# Patient Record
Sex: Female | Born: 2000 | Race: Black or African American | Hispanic: No | Marital: Single | Smoking: Never smoker
Health system: Southern US, Community
[De-identification: ages and names within clinical notes are randomized; demographics above are authoritative.]

## PROBLEM LIST (undated history)

## (undated) DIAGNOSIS — Z789 Other specified health status: Secondary | ICD-10-CM

## (undated) HISTORY — PX: NO PAST SURGERIES: SHX2092

---
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
## 2018-11-27 ENCOUNTER — Emergency Department (HOSPITAL_COMMUNITY)
Admission: EM | Admit: 2018-11-27 | Discharge: 2018-11-27 | Disposition: A | Discharge status: Home / Self Care | Payer: BLUE CROSS/BLUE SHIELD | Attending: Emergency Medicine | Admitting: Emergency Medicine

## 2018-11-27 ENCOUNTER — Other Ambulatory Visit: Payer: Self-pay

## 2018-11-27 ENCOUNTER — Emergency Department (HOSPITAL_COMMUNITY): Payer: BLUE CROSS/BLUE SHIELD

## 2018-11-27 ENCOUNTER — Encounter (HOSPITAL_COMMUNITY): Payer: Self-pay | Admitting: Emergency Medicine

## 2018-11-27 DIAGNOSIS — R69 Illness, unspecified: Secondary | ICD-10-CM

## 2018-11-27 DIAGNOSIS — R509 Fever, unspecified: Secondary | ICD-10-CM

## 2018-11-27 DIAGNOSIS — J111 Influenza due to unidentified influenza virus with other respiratory manifestations: Secondary | ICD-10-CM

## 2018-11-27 DIAGNOSIS — R059 Cough, unspecified: Secondary | ICD-10-CM

## 2018-11-27 DIAGNOSIS — R05 Cough: Secondary | ICD-10-CM | POA: Diagnosis not present

## 2018-11-27 LAB — GROUP A STREP BY PCR: GROUP A STREP BY PCR: NOT DETECTED

## 2018-11-27 MED ORDER — IPRATROPIUM-ALBUTEROL 0.5-2.5 (3) MG/3ML IN SOLN
3.0000 mL | Freq: Once | RESPIRATORY_TRACT | Status: AC
  Administered 2018-11-27: 3 mL via RESPIRATORY_TRACT
  Filled 2018-11-27: qty 3

## 2018-11-27 MED ORDER — ACETAMINOPHEN 325 MG PO TABS
650.0000 mg | ORAL_TABLET | Freq: Once | ORAL | Status: DC | PRN
  Filled 2018-11-27 (×2): qty 2

## 2018-11-27 MED ORDER — ALBUTEROL SULFATE 108 (90 BASE) MCG/ACT IN AEPB: 2.0000 | INHALATION_SPRAY | RESPIRATORY_TRACT | 0 refills | Status: DC | PRN

## 2018-11-27 NOTE — Discharge Instructions (Signed)

## 2018-11-27 NOTE — ED Triage Notes (Signed)
Pt c/o fever and sore throat x 2 days.

## 2018-11-27 NOTE — ED Provider Notes (Signed)
[facility] EMERGENCY DEPARTMENT Provider Note   CSN: [id number] Arrival date & time: 11/27/18  1518    History   Chief Complaint Chief Complaint  Patient presents with  . Sore Throat    HPI April Ramos is a 18 y.o. female. SUBJECTIVE:  April Ramos is a 18 y.o. female who present complaining of flu-like symptoms: fevers, chills, myalgias, congestion, sore throat and cough for 3 days. Denies dyspnea. + wheezing. She smokes She denies a hx of asthma          History reviewed. No pertinent past medical history.  There are no active problems to display for this patient.   History reviewed. No pertinent surgical history.   OB History   No obstetric history on file.      Home Medications    Prior to Admission medications   Medication Sig Start Date End Date Taking? Authorizing Provider  cephALEXin (KEFLEX) 500 MG capsule Take 1 capsule (500 mg total) by mouth 4 (four) times daily. Patient not taking: Reported on 11/27/2018 03/10/17   Elpidio Anis, PA-C  polyethylene glycol [facility]) packet Take 17 g by mouth 3 (three) times daily. Maximum 3 consecutive days. Patient not taking: Reported on 11/27/2018 03/10/17   Elpidio Anis, PA-C    Family History No family history on file.  Social History Social History   Tobacco Use  . Smoking status: Never Smoker  . Smokeless tobacco: Never Used  Substance Use Topics  . Alcohol use: No  . Drug use: No     Allergies   Patient has no known allergies.   Review of Systems Review of Systems  Ten systems reviewed and are negative for acute change, except as noted in the HPI.   Physical Exam Updated Vital Signs BP 120/76 (BP Location: Right Arm)   Pulse (!) 110   Temp (!) 101.6 F (38.7 C) (Oral)   LMP 11/13/2018   SpO2 100%   Physical Exam Vitals signs and nursing note reviewed.  Constitutional:      General: She is not in acute distress.    Appearance: She is well-developed. She is  ill-appearing. She is not toxic-appearing or diaphoretic.  HENT:     Head: Normocephalic and atraumatic.     Mouth/Throat:     Mouth: No oral lesions.     Pharynx: Posterior oropharyngeal erythema present. No pharyngeal swelling, oropharyngeal exudate or uvula swelling.  Eyes:     General: No scleral icterus.    Conjunctiva/sclera: Conjunctivae normal.  Neck:     Musculoskeletal: Normal range of motion.  Cardiovascular:     Rate and Rhythm: Normal rate and regular rhythm.     Heart sounds: Normal heart sounds. No murmur. No friction rub. No gallop.   Pulmonary:     Effort: Pulmonary effort is normal. No respiratory distress.     Breath sounds: Wheezing present.  Abdominal:     General: Bowel sounds are normal. There is no distension.     Palpations: Abdomen is soft. There is no mass.     Tenderness: There is no abdominal tenderness. There is no guarding.  Skin:    General: Skin is warm and dry.  Neurological:     Mental Status: She is alert and oriented to person, place, and time.  Psychiatric:        Behavior: Behavior normal.      ED Treatments / Results  Labs (all labs ordered are listed, but only abnormal results are displayed) Labs Reviewed  GROUP A STREP BY PCR    EKG None  Radiology No results found.  Procedures Procedures (including critical care time)  Medications Ordered in ED Medications  acetaminophen (TYLENOL) tablet 650 mg (has no administration in time range)  ipratropium-albuterol (DUONEB) 0.5-2.5 (3) MG/3ML nebulizer solution 3 mL (has no administration in time range)     Initial Impression / Assessment and Plan / ED Course  I have reviewed the triage vital signs and the nursing notes.  Pertinent labs & imaging results that were available during my care of the patient were reviewed by me and considered in my medical decision making (see chart for details).       Patient with symptoms consistent with influenza.  Vitals are stable, low-grade  fever.  No signs of dehydration, tolerating PO's.  Patient with wheezing, daily smoker.The patient was counseled on the dangers of tobacco use, and was advised to quit. Reviewed strategies to maximize success, including removing cigarettes and smoking materials from environment, stress management, substitution of other forms of reinforcement, support of family/friends and written materials. Chest x-ray negative for acute abnormality.  I reviewed patient's PA lateral chest film and agree with interpretation.  She is out of the window for treatment with anti-flu medications.    Patient will be discharged with instructions to orally hydrate, rest, and use over-the-counter medications such as anti-inflammatories ibuprofen and Aleve for muscle aches and Tylenol for fever.  Patient will also be given a cough suppressant.    MDM Number of Diagnoses or Management Options Influenza-like illness:     Final Clinical Impressions(s) / ED Diagnoses   Final diagnoses:  Cough  Fever    ED Discharge Orders    None       Latigra, Mikrut, PA-C 11/27/18 2254    Rolan Bucco, MD 11/28/18 1227

## 2019-08-07 ENCOUNTER — Other Ambulatory Visit: Payer: Self-pay

## 2019-08-07 ENCOUNTER — Encounter (HOSPITAL_COMMUNITY): Payer: Self-pay | Admitting: *Deleted

## 2019-08-07 ENCOUNTER — Emergency Department (HOSPITAL_COMMUNITY)
Admission: EM | Admit: 2019-08-07 | Discharge: 2019-08-07 | Disposition: A | Discharge status: Home / Self Care | Payer: 59 | Attending: Emergency Medicine | Admitting: Emergency Medicine

## 2019-08-07 DIAGNOSIS — R55 Syncope and collapse: Secondary | ICD-10-CM | POA: Insufficient documentation

## 2019-08-07 DIAGNOSIS — R5383 Other fatigue: Secondary | ICD-10-CM | POA: Diagnosis not present

## 2019-08-07 LAB — BASIC METABOLIC PANEL
Anion gap: 6 (ref 5–15)
BUN: 11 mg/dL (ref 6–20)
CO2: 26 mmol/L (ref 22–32)
Calcium: 8.7 mg/dL — ABNORMAL LOW (ref 8.9–10.3)
Chloride: 106 mmol/L (ref 98–111)
Creatinine, Ser: 0.69 mg/dL (ref 0.44–1.00)
GFR calc Af Amer: >60 mL/min (ref >60)
GFR calc non Af Amer: >60 mL/min (ref >60)
Glucose, Bld: 98 mg/dL (ref 70–99)
Potassium: 3.7 mmol/L (ref 3.5–5.1)
Sodium: 138 mmol/L (ref 135–145)

## 2019-08-07 LAB — URINALYSIS, ROUTINE W REFLEX MICROSCOPIC
Bilirubin Urine: NEGATIVE
Glucose, UA: NEGATIVE mg/dL
Ketones, ur: NEGATIVE mg/dL
Leukocytes,Ua: NEGATIVE
Nitrite: NEGATIVE
Protein, ur: 100 mg/dL — AB
RBC / HPF: >50 RBC/hpf — ABNORMAL HIGH (ref 0–5)
Specific Gravity, Urine: 1.028 (ref 1.005–1.030)
pH: 6 (ref 5.0–8.0)

## 2019-08-07 LAB — CBC
HCT: 43.1 % (ref 36.0–46.0)
Hemoglobin: 13.4 g/dL (ref 12.0–15.0)
MCH: 30.4 pg (ref 26.0–34.0)
MCHC: 31.1 g/dL (ref 30.0–36.0)
MCV: 97.7 fL (ref 80.0–100.0)
Platelets: 200 10*3/uL (ref 150–400)
RBC: 4.41 MIL/uL (ref 3.87–5.11)
RDW: 13.3 % (ref 11.5–15.5)
WBC: 11.7 10*3/uL — ABNORMAL HIGH (ref 4.0–10.5)
nRBC: 0 % (ref 0.0–0.2)

## 2019-08-07 LAB — I-STAT BETA HCG BLOOD, ED (MC, WL, AP ONLY): I-stat hCG, quantitative: <5 m[IU]/mL (ref <5)

## 2019-08-07 LAB — CBG MONITORING, ED: Glucose-Capillary: 68 mg/dL — ABNORMAL LOW (ref 70–99)

## 2019-08-07 MED ORDER — SODIUM CHLORIDE 0.9% FLUSH: 3.0000 mL | Freq: Once | INTRAVENOUS | Status: DC

## 2019-08-07 MED ORDER — ONDANSETRON 4 MG PO TBDP
4.0000 mg | ORAL_TABLET | Freq: Once | ORAL | Status: AC
  Administered 2019-08-07: 23:00:00 4 mg via ORAL
  Filled 2019-08-07: qty 1

## 2019-08-07 NOTE — ED Provider Notes (Addendum)
[facility]-EMERGENCY DEPT Provider Note   CSN: [id number] Arrival date & time: 08/07/19  1859     History   Chief Complaint Chief Complaint  Patient presents with  . Fatigue    HPI April Ramos is a 18 y.o. female.     Patient presents to the emergency department with a chief complaint of syncopal episode.  She states that over the past couple of days she has felt fatigued.  She states that today while getting ready she became lightheaded and passed out in her bathroom.  She reports that she had eaten pizza about 30 minutes prior to this episode.  She denies having any chest pain or shortness of breath prior to passing out.  Denies any history of PE or DVT.  Denies any recent long travel, surgery, or immobilization.  She does not take any medications on a daily basis.  She is currently on her menstrual cycle, which she states are irregular, but not particularly heavy.  She denies any fever, chills, cough.  She recently tested negative for coronavirus.  The history is provided by the patient. No language interpreter was used.    History reviewed. No pertinent past medical history.  There are no active problems to display for this patient.   History reviewed. No pertinent surgical history.   OB History   No obstetric history on file.      Home Medications    Prior to Admission medications   Medication Sig Start Date End Date Taking? Authorizing Provider  Albuterol Sulfate (PROAIR RESPICLICK) 108 (90 Base) MCG/ACT AEPB Inhale 2 puffs into the lungs every 4 (four) hours as needed (cough and wheezing). Patient not taking: Reported on 08/07/2019 11/27/18   Arthor Captain, PA-C    Family History No family history on file.  Social History Social History   Tobacco Use  . Smoking status: Never Smoker  . Smokeless tobacco: Never Used  Substance Use Topics  . Alcohol use: No  . Drug use: No     Allergies   Patient has no known allergies.    Review of Systems Review of Systems  All other systems reviewed and are negative.    Physical Exam Updated Vital Signs BP 103/60 (BP Location: Right Arm)   Pulse 67   Temp 98.2 F (36.8 C) (Oral)   Resp 15   Ht 5\' 2"  (1.575 m)   Wt 45.4 kg   LMP 08/04/2019   SpO2 100%   BMI 18.29 kg/m   Physical Exam Vitals signs and nursing note reviewed.  Constitutional:      General: She is not in acute distress.    Appearance: She is well-developed.  HENT:     Head: Normocephalic and atraumatic.  Eyes:     Conjunctiva/sclera: Conjunctivae normal.  Neck:     Musculoskeletal: Neck supple.  Cardiovascular:     Rate and Rhythm: Normal rate.  Pulmonary:     Effort: Pulmonary effort is normal. No respiratory distress.  Abdominal:     General: There is no distension.  Musculoskeletal: Normal range of motion.  Skin:    General: Skin is warm and dry.  Neurological:     Mental Status: She is alert and oriented to person, place, and time.  Psychiatric:        Mood and Affect: Mood normal.        Behavior: Behavior normal.      ED Treatments / Results  Labs (all labs ordered are listed, but only  abnormal results are displayed) Labs Reviewed  BASIC METABOLIC PANEL - Abnormal; Notable for the following components:      Result Value   Calcium 8.7 (*)    All other components within normal limits  CBC - Abnormal; Notable for the following components:   WBC 11.7 (*)    All other components within normal limits  URINALYSIS, ROUTINE W REFLEX MICROSCOPIC - Abnormal; Notable for the following components:   APPearance HAZY (*)    Hgb urine dipstick LARGE (*)    Protein, ur 100 (*)    RBC / HPF >50 (*)    Bacteria, UA RARE (*)    All other components within normal limits  CBG MONITORING, ED - Abnormal; Notable for the following components:   Glucose-Capillary 68 (*)    All other components within normal limits  I-STAT BETA HCG BLOOD, ED ([facility], [facility], [facility] ONLY)    EKG EKG  Interpretation  Date/Time:  Wednesday August 07 2019 19:52:57 EST Ventricular Rate:  54 PR Interval:    QRS Duration: 74 QT Interval:  407 QTC Calculation: 386 R Axis:   66 Text Interpretation: Sinus rhythm Borderline short PR interval Nonspecific T wave changes noted no prior for comparison Confirmed by Madalyn Rob [phone]) on 08/07/2019 10:50:57 PM   Radiology No results found.  Procedures Procedures (including critical care time)  Medications Ordered in ED Medications  sodium chloride flush (NS) 0.9 % injection 3 mL (has no administration in time range)     Initial Impression / Assessment and Plan / ED Course  I have reviewed the triage vital signs and the nursing notes.  Pertinent labs & imaging results that were available during my care of the patient were reviewed by me and considered in my medical decision making (see chart for details).        Patient with syncopal episode earlier today.  She reports having felt fatigued for the past couple of days.  She states that she has been eating and drinking.  States that she ate pizza about 1/2-hour prior to passing out.  She denies any chest pain or shortness of breath.  Denies any recent illnesses.  Denies any fever, chills, cough.   I doubt PE.  She is not hypoxic, nor is she tachycardic.  She has not had any chest pain or shortness of breath.  She is not pregnant, hCG is negative.  Blood pressure noted to be 103/60, she is small and thin, I do not believe that this will be significantly abnormal for the patient.  Recommend close follow-up.  Hemoglobin is normal, no evidence of acute blood loss anemia.  EKG shows shortened PR, reviewed with Dr. Roslynn Amble, not suggestive of WPW.  Agrees patient can follow-up outpatient.  Electrolytes are reassuring.  Recommend close follow-up with PCP.  Patient understands agrees to plan.  She is requesting to be discharged.  Final Clinical Impressions(s) / ED Diagnoses   Final  diagnoses:  Syncope, unspecified syncope type  Fatigue, unspecified type    ED Discharge Orders    None       Montine Circle, PA-C 08/07/19 2247    Montine Circle, PA-C 08/07/19 2251    Lucrezia Starch, MD 08/08/19 0000

## 2019-08-07 NOTE — ED Notes (Signed)
Pt refuses to dress into a gown 

## 2019-08-07 NOTE — ED Notes (Signed)
Pt given orange juice in triage for low blood sugar. Triage RN Claiborne Billings aware.

## 2019-08-07 NOTE — ED Notes (Signed)
Patient feels nauseas.  

## 2019-08-07 NOTE — ED Triage Notes (Signed)
Pt arrives ambulatory to triage stating she has felt very tired over the past several days. Today, she was sitting on the toilet and passed out. She said she does remember easing herself to the ground. Denies n/v/d or fevers. Some constipation.

## 2019-08-07 NOTE — Discharge Instructions (Addendum)
Return to the ER if your symptoms change or worsen.  No clear explanation for your symptoms was found tonight.  You will need to continue the workup with a primary care doctor.  Please contact the clinic listed for a follow-up visit.

## 2019-10-29 DIAGNOSIS — R87619 Unspecified abnormal cytological findings in specimens from cervix uteri: Secondary | ICD-10-CM | POA: Insufficient documentation

## 2020-10-03 NOTE — L&D Delivery Note (Signed)
Delivery Note Pt pushed nearly 3 hours.  When about 5cms caput visible w/pushes, FHR remained in the 80's -90's.  Dr. Crissie Reese in room, consented pt for vacuum.  Verbal consent: obtained from patient. Risks and benefits discussed in detail. Risks include, but are not limited to the risks of anesthesia, bleeding, infection, damage to maternal tissues, fetal cephalhematoma. There is also the risk of inability to effect vaginal delivery of the head, or shoulder dystocia that cannot be resolved by established maneuvers, leading to the need for emergency cesarean section.  A Kiwi was applied, and with one pull, at 8:27 AM a viable female was delivered via Vaginal, Vacuum (Extractor) (Presentation: Left Occiput Anterior). Head came out in an OA/LOA position, but the left shoulder was anterior, so baby restituted to ROA.  The anterior axilla was grasped and traction applied.  At no time was excessive traction placed upon the baby's head. Once the anterior shoulder was delivered, the rest of the baby came with ease.  APGAR: 8/9, ; weight pending.  After 1 minute, the cord was clamped and cut. 40 units of pitocin diluted in 1000cc LR was infused rapidly IV.  The placenta separated spontaneously and delivered via CCT and maternal pushing effort.  It was inspected and appears to be intact with a 3 VC.   Anesthesia: Epidural Episiotomy:   Lacerations:  1st degree perineal, bleeding Suture Repair: 3.0 vicryl Est. Blood Loss (mL):  250 Mom to postpartum.  Baby to Couplet care / Skin to Skin.  April Ramos 09/13/2021, 9:04 AM

## 2021-01-26 ENCOUNTER — Inpatient Hospital Stay (HOSPITAL_COMMUNITY)
Admission: AD | Admit: 2021-01-26 | Discharge: 2021-01-26 | Disposition: A | Discharge status: Home / Self Care | Payer: BC Managed Care – PPO | Attending: Obstetrics and Gynecology | Admitting: Obstetrics and Gynecology

## 2021-01-26 ENCOUNTER — Other Ambulatory Visit: Payer: Self-pay

## 2021-01-26 ENCOUNTER — Encounter (HOSPITAL_COMMUNITY): Payer: Self-pay | Admitting: Obstetrics and Gynecology

## 2021-01-26 ENCOUNTER — Inpatient Hospital Stay (HOSPITAL_COMMUNITY): Payer: BC Managed Care – PPO

## 2021-01-26 DIAGNOSIS — O219 Vomiting of pregnancy, unspecified: Secondary | ICD-10-CM

## 2021-01-26 DIAGNOSIS — R109 Unspecified abdominal pain: Secondary | ICD-10-CM

## 2021-01-26 DIAGNOSIS — Z3A01 Less than 8 weeks gestation of pregnancy: Secondary | ICD-10-CM

## 2021-01-26 DIAGNOSIS — O26891 Other specified pregnancy related conditions, first trimester: Secondary | ICD-10-CM | POA: Diagnosis not present

## 2021-01-26 DIAGNOSIS — R1030 Lower abdominal pain, unspecified: Secondary | ICD-10-CM | POA: Insufficient documentation

## 2021-01-26 DIAGNOSIS — O210 Mild hyperemesis gravidarum: Secondary | ICD-10-CM | POA: Diagnosis not present

## 2021-01-26 HISTORY — DX: Other specified health status: Z78.9

## 2021-01-26 LAB — CBC
HCT: 41.1 % (ref 36.0–46.0)
Hemoglobin: 13.5 g/dL (ref 12.0–15.0)
MCH: 30.3 pg (ref 26.0–34.0)
MCHC: 32.8 g/dL (ref 30.0–36.0)
MCV: 92.4 fL (ref 80.0–100.0)
Platelets: 154 10*3/uL (ref 150–400)
RBC: 4.45 MIL/uL (ref 3.87–5.11)
RDW: 12.2 % (ref 11.5–15.5)
WBC: 6.5 10*3/uL (ref 4.0–10.5)
nRBC: 0 % (ref 0.0–0.2)

## 2021-01-26 LAB — COMPREHENSIVE METABOLIC PANEL
ALT: 17 U/L (ref 0–44)
AST: 21 U/L (ref 15–41)
Albumin: 3.9 g/dL (ref 3.5–5.0)
Alkaline Phosphatase: 28 U/L — ABNORMAL LOW (ref 38–126)
Anion gap: 7 (ref 5–15)
BUN: 5 mg/dL — ABNORMAL LOW (ref 6–20)
CO2: 23 mmol/L (ref 22–32)
Calcium: 9.4 mg/dL (ref 8.9–10.3)
Chloride: 103 mmol/L (ref 98–111)
Creatinine, Ser: 0.59 mg/dL (ref 0.44–1.00)
GFR, Estimated: >60 mL/min (ref >60)
Glucose, Bld: 93 mg/dL (ref 70–99)
Potassium: 4.2 mmol/L (ref 3.5–5.1)
Sodium: 133 mmol/L — ABNORMAL LOW (ref 135–145)
Total Bilirubin: 0.6 mg/dL (ref 0.3–1.2)
Total Protein: 7.1 g/dL (ref 6.5–8.1)

## 2021-01-26 LAB — URINALYSIS, ROUTINE W REFLEX MICROSCOPIC
Bilirubin Urine: NEGATIVE
Glucose, UA: NEGATIVE mg/dL
Hgb urine dipstick: NEGATIVE
Ketones, ur: 20 mg/dL — AB
Nitrite: NEGATIVE
Protein, ur: NEGATIVE mg/dL
Specific Gravity, Urine: 1.026 (ref 1.005–1.030)
pH: 6 (ref 5.0–8.0)

## 2021-01-26 LAB — ABO/RH: ABO/RH(D): A POS

## 2021-01-26 LAB — WET PREP, GENITAL
Sperm: NONE SEEN
Trich, Wet Prep: NONE SEEN
Yeast Wet Prep HPF POC: NONE SEEN

## 2021-01-26 LAB — POCT PREGNANCY, URINE: Preg Test, Ur: POSITIVE — AB

## 2021-01-26 LAB — HCG, QUANTITATIVE, PREGNANCY: hCG, Beta Chain, Quant, S: 225932 m[IU]/mL — ABNORMAL HIGH (ref <5)

## 2021-01-26 MED ORDER — FAMOTIDINE IN NACL 20-0.9 MG/50ML-% IV SOLN
20.0000 mg | Freq: Once | INTRAVENOUS | Status: AC
  Administered 2021-01-26: 20 mg via INTRAVENOUS
  Filled 2021-01-26: qty 50

## 2021-01-26 MED ORDER — METOCLOPRAMIDE HCL 10 MG PO TABS: 10.0000 mg | ORAL_TABLET | Freq: Three times a day (TID) | ORAL | 1 refills | Status: DC

## 2021-01-26 MED ORDER — LACTATED RINGERS IV BOLUS
1000.0000 mL | Freq: Once | INTRAVENOUS | Status: AC
  Administered 2021-01-26: 1000 mL via INTRAVENOUS

## 2021-01-26 MED ORDER — FAMOTIDINE 20 MG PO TABS: 20.0000 mg | ORAL_TABLET | Freq: Every day | ORAL | 1 refills | Status: DC

## 2021-01-26 MED ORDER — METOCLOPRAMIDE HCL 5 MG/ML IJ SOLN
10.0000 mg | Freq: Once | INTRAMUSCULAR | Status: AC
  Administered 2021-01-26: 10 mg via INTRAVENOUS
  Filled 2021-01-26: qty 2

## 2021-01-26 NOTE — MAU Note (Signed)
initially she came in because of extreme pain in lower abd. Started feeling worse while waiting. Feels weak. Throat kind of hurts. Little cough. preg confirmed at HD. Sees pink when she wipes, first noted 2 or 3 days ago.

## 2021-01-26 NOTE — Discharge Instructions (Signed)
Abdominal Pain During Pregnancy Abdominal pain is common during pregnancy and has many possible causes. Some causes are more serious than others, and sometimes the cause is not known. Abdominal pain can be a sign that labor is starting. It can also be caused by normal growth of your baby causing stretching of muscles and ligaments during pregnancy. Always tell your health care provider if you have any abdominal pain. Follow these instructions at home:  Do not have sex or put anything in your vagina until your pain goes away completely.  Get plenty of rest until your pain improves.  Drink enough fluid to keep your urine pale yellow.  Take over-the-counter and prescription medicines only as told by your health care provider.  Keep all follow-up visits. This is important.   Contact a health care provider if:  Your pain continues or gets worse after resting.  You have lower abdominal pain that: ? Comes and goes at regular intervals. ? Spreads to your back. ? Is similar to menstrual cramps.  You have pain or burning when you urinate. Get help right away if:  You have a fever, chills, or shortness of breath.  You have vaginal bleeding.  You are leaking fluid or passing tissue from your vagina.  You have vomiting or diarrhea that lasts for more than 24 hours.  Your baby is moving less than usual.  You feel very weak or faint.  You develop severe pain in your upper abdomen. Summary  Abdominal pain is common during pregnancy and has many possible causes.  If you experience abdominal pain during pregnancy, tell your health care provider right away.  Follow your health care provider's home care instructions and keep all follow-up visits as told. This information is not intended to replace advice given to you by your health care provider. Make sure you discuss any questions you have with your health care provider. Document Revised: 06/02/2020 Document Reviewed: 06/02/2020 Elsevier  Patient Education  2021 Elsevier Inc.        Obstetrics: Normal and Problem Pregnancies (7th ed., pp. 102-121). [place], [place]: Elsevier."> Textbook of Family Medicine (9th ed., pp. [phone]). [place], [place]: Elsevier [employer].">  First Trimester of Pregnancy  The first trimester of pregnancy starts on the first day of your last menstrual period until the end of week 12. This is months 1 through 3 of pregnancy. A week after a sperm fertilizes an egg, the egg will implant into the wall of the uterus and begin to develop into a baby. By the end of 12 weeks, all the baby's organs will be formed and the baby will be 2-3 inches in size. Body changes during your first trimester Your body goes through many changes during pregnancy. The changes vary and generally return to normal after your baby is born. Physical changes  You may gain or lose weight.  Your breasts may begin to grow larger and become tender. The tissue that surrounds your nipples (areola) may become darker.  Dark spots or blotches (chloasma or mask of pregnancy) may develop on your face.  You may have changes in your hair. These can include thickening or thinning of your hair or changes in texture. Health changes  You may feel nauseous, and you may vomit.  You may have heartburn.  You may develop headaches.  You may develop constipation.  Your gums may bleed and may be sensitive to brushing and flossing. Other changes  You may tire easily.  You may urinate more often.  Your menstrual  periods will stop.  You may have a loss of appetite.  You may develop cravings for certain kinds of food.  You may have changes in your emotions from day to day.  You may have more vivid and strange dreams. Follow these instructions at home: Medicines  Follow your health care provider's instructions regarding medicine use. Specific medicines may be either safe or unsafe to take during pregnancy. Do not take any medicines  unless told to by your health care provider.  Take a prenatal vitamin that contains at least 600 micrograms (mcg) of folic acid. Eating and drinking  Eat a healthy diet that includes fresh fruits and vegetables, whole grains, good sources of protein such as meat, eggs, or tofu, and low-fat dairy products.  Avoid raw meat and unpasteurized juice, milk, and cheese. These carry germs that can harm you and your baby.  If you feel nauseous or you vomit: ? Eat 4 or 5 small meals a day instead of 3 large meals. ? Try eating a few soda crackers. ? Drink liquids between meals instead of during meals.  You may need to take these actions to prevent or treat constipation: ? Drink enough fluid to keep your urine pale yellow. ? Eat foods that are high in fiber, such as beans, whole grains, and fresh fruits and vegetables. ? Limit foods that are high in fat and processed sugars, such as fried or sweet foods. Activity  Exercise only as directed by your health care provider. Most people can continue their usual exercise routine during pregnancy. Try to exercise for 30 minutes at least 5 days a week.  Stop exercising if you develop pain or cramping in the lower abdomen or lower back.  Avoid exercising if it is very hot or humid or if you are at high altitude.  Avoid heavy lifting.  If you choose to, you may have sex unless your health care provider tells you not to. Relieving pain and discomfort  Wear a good support bra to relieve breast tenderness.  Rest with your legs elevated if you have leg cramps or low back pain.  If you develop bulging veins (varicose veins) in your legs: ? Wear support hose as told by your health care provider. ? Elevate your feet for 15 minutes, 3-4 times a day. ? Limit salt in your diet. Safety  Wear your seat belt at all times when driving or riding in a car.  Talk with your health care provider if someone is verbally or physically abusive to you.  Talk with  your health care provider if you are feeling sad or have thoughts of hurting yourself. Lifestyle  Do not use hot tubs, steam rooms, or saunas.  Do not douche. Do not use tampons or scented sanitary pads.  Do not use herbal remedies, alcohol, illegal drugs, or medicines that are not approved by your health care provider. Chemicals in these products can harm your baby.  Do not use any products that contain nicotine or tobacco, such as cigarettes, e-cigarettes, and chewing tobacco. If you need help quitting, ask your health care provider.  Avoid cat litter boxes and soil used by cats. These carry germs that can cause birth defects in the baby and possibly loss of the unborn baby (fetus) by miscarriage or stillbirth. General instructions  During routine prenatal visits in the first trimester, your health care provider will do a physical exam, perform necessary tests, and ask you how things are going. Keep all follow-up visits. This is  important.  Ask for help if you have counseling or nutritional needs during pregnancy. Your health care provider can offer advice or refer you to specialists for help with various needs.  Schedule a dentist appointment. At home, brush your teeth with a soft toothbrush. Floss gently.  Write down your questions. Take them to your prenatal visits. Where to find more information  American Pregnancy Association: americanpregnancy.org  [employer]necologists: https://www.todd-brady.net/  Office on [employer] Health: MightyReward.co.nz Contact a health care provider if you have:  Dizziness.  A fever.  Mild pelvic cramps, pelvic pressure, or nagging pain in the abdominal area.  Nausea, vomiting, or diarrhea that lasts for 24 hours or longer.  A bad-smelling vaginal discharge.  Pain when you urinate.  Known exposure to a contagious illness, such as chickenpox, measles, Zika virus, HIV, or hepatitis. Get help  right away if you have:  Spotting or bleeding from your vagina.  Severe abdominal cramping or pain.  Shortness of breath or chest pain.  Any kind of trauma, such as from a fall or a car crash.  New or increased pain, swelling, or redness in an arm or leg. Summary  The first trimester of pregnancy starts on the first day of your last menstrual period until the end of week 12 (months 1 through 3).  Eating 4 or 5 small meals a day rather than 3 large meals may help to relieve nausea and vomiting.  Do not use any products that contain nicotine or tobacco, such as cigarettes, e-cigarettes, and chewing tobacco. If you need help quitting, ask your health care provider.  Keep all follow-up visits. This is important. This information is not intended to replace advice given to you by your health care provider. Make sure you discuss any questions you have with your health care provider. Document Revised: 02/26/2020 Document Reviewed: 01/02/2020 Elsevier Patient Education  2021 Elsevier Inc.                        Safe Medications in Pregnancy    Acne: Benzoyl Peroxide Salicylic Acid  Backache/Headache: Tylenol: 2 regular strength every 4 hours OR              2 Extra strength every 6 hours  Colds/Coughs/Allergies: Benadryl (alcohol free) 25 mg every 6 hours as needed Breath right strips Claritin Cepacol throat lozenges Chloraseptic throat spray Cold-Eeze- up to three times per day Cough drops, alcohol free Flonase (by prescription only) Guaifenesin Mucinex Robitussin DM (plain only, alcohol free) Saline nasal spray/drops Sudafed (pseudoephedrine) & Actifed ** use only after [redacted] weeks gestation and if you do not have high blood pressure Tylenol Vicks Vaporub Zinc lozenges Zyrtec   Constipation: Colace Ducolax suppositories Fleet enema Glycerin suppositories Metamucil Milk of magnesia Miralax Senokot Smooth move tea  Diarrhea: Kaopectate Imodium A-D  *NO  pepto Bismol  Hemorrhoids: Anusol Anusol HC Preparation H Tucks  Indigestion: Tums Maalox Mylanta Zantac  Pepcid  Insomnia: Benadryl (alcohol free) 25mg  every 6 hours as needed Tylenol PM Unisom, no Gelcaps  Leg Cramps: Tums MagGel  Nausea/Vomiting:  Bonine Dramamine Emetrol Ginger extract Sea bands Meclizine  Nausea medication to take during pregnancy:  Unisom (doxylamine succinate 25 mg tablets) Take one tablet daily at bedtime. If symptoms are not adequately controlled, the dose can be increased to a maximum recommended dose of two tablets daily (1/2 tablet in the morning, 1/2 tablet mid-afternoon and one at bedtime). Vitamin B6 100mg  tablets. Take one  tablet twice a day (up to 200 mg per day).  Skin Rashes: Aveeno products Benadryl cream or 25mg  every 6 hours as needed Calamine Lotion 1% cortisone cream  Yeast infection: Gyne-lotrimin 7 Monistat 7   **If taking multiple medications, please check labels to avoid duplicating the same active ingredients **take medication as directed on the label ** Do not exceed 4000 mg of tylenol in 24 hours **Do not take medications that contain aspirin or ibuprofen          Prenatal Care Providers           [facility] @ [facility] - accepts patients without insurance  Phone: [phone]  [facility] for 010-2725 @ [facility]   Phone: [phone]  [facility] @[place]       Phone: [phone]            [facility] @ [place]     Phone: [phone]          [facility] for [place] @ [phone]   Phone: [phone]  [facility] @ [facility] - accepts patients without insurance  Phone: [phone]  [facility] @ [facility] Phone: [phone]     [facility] - accepts patients without insurance Phone: [phone]  [facility]  Phone: [phone]  [facility] Phone: [phone]  [facility] Phone: [phone]  [facility] Phone: [phone]  [facility] Phone: [phone]  [facility]  Phone: [phone]

## 2021-01-26 NOTE — MAU Provider Note (Signed)
History     CSN: [id number]  Arrival date and time: 01/26/21 2585   Event Date/Time   First Provider Initiated Contact with Patient 01/26/21 1304      Chief Complaint  Patient presents with  . Abdominal Pain  . Vaginal Bleeding  . Possible Pregnancy   Ms. April Ramos is a 20 y.o. G1P0 at 40w3dwho presents to MAU for lower abdominal pain. Patient reports this started about 2-3 days ago and reports the pain is intermittent. Patient reports the pain is not present at this time. Patient describes the pain as "sharp pains" which is how her period cramps feel, but reports this is a "little bit worse." Patient reports she has not taken anything for the pain because she did not know what was safe to take in pregnancy. Patient reports some light pink spotting only when wiping occasionally. Patient denies abnormal vaginal discharge.  Patient also reports nausea and vomiting that started about 3 weeks ago, and patient reports she was only having morning sickness, which resolved over the past week. Patient reports then last night/early this morning she started "violently vomiting" and reports vomiting about 7-8 times in the past 24 hours. Patient reports the last time she had anything to eat was last night before the vomiting started and she had mac and cheese and corn. Patient is not actively vomiting at this time, but does report intermittent nausea.  Pt denies LOF, ctx, decreased FM, vaginal discharge/odor/itching. Pt denies constipation, diarrhea, or urinary problems. Pt denies fever, chills, fatigue, sweating or changes in appetite. Pt denies SOB or chest pain. Pt denies dizziness, HA, light-headedness, weakness.  Problems this pregnancy include: pt has not yet been seen. Allergies? NKDA Current medications/supplements? PNVs Prenatal care provider? Pt requests list of OB providers   OB History    Gravida  1   Para      Term      Preterm      AB      Living        SAB       IAB      Ectopic      Multiple      Live Births              Past Medical History:  Diagnosis Date  . Medical history non-contributory     Past Surgical History:  Procedure Laterality Date  . NO PAST SURGERIES      Family History  Problem Relation Age of Onset  . Fibroids Mother   . Anemia Mother   . Diabetes Father     Social History   Tobacco Use  . Smoking status: Never Smoker  . Smokeless tobacco: Never Used  Vaping Use  . Vaping Use: Former  Substance Use Topics  . Alcohol use: Not Currently    Comment: Social  . Drug use: Not Currently    Types: Marijuana    Allergies: No Known Allergies  Medications Prior to Admission  Medication Sig Dispense Refill Last Dose  . Prenatal Vit-Fe Fumarate-FA (MULTIVITAMIN-PRENATAL) 27-0.8 MG TABS tablet Take 1 tablet by mouth daily at 12 noon.   01/25/2021 at Unknown time  . Albuterol Sulfate (PROAIR RESPICLICK) 1277(90 Base) MCG/ACT AEPB Inhale 2 puffs into the lungs every 4 (four) hours as needed (cough and wheezing). (Patient not taking: Reported on 08/07/2019) 1 each 0     Review of Systems  Constitutional: Negative for chills, diaphoresis, fatigue and fever.  Eyes: Negative for visual disturbance.  Respiratory: Negative for shortness of breath.   Cardiovascular: Negative for chest pain.  Gastrointestinal: Positive for abdominal pain, nausea and vomiting. Negative for constipation and diarrhea.  Genitourinary: Positive for vaginal bleeding. Negative for dysuria, flank pain, frequency, pelvic pain, urgency and vaginal discharge.  Neurological: Negative for dizziness, weakness, light-headedness and headaches.   Physical Exam   Blood pressure 121/61, pulse 96, temperature 99.6 F (37.6 C), temperature source Oral, resp. rate 18, height _0  (1.575 m), weight 47.2 kg, last menstrual period 11/05/2020, SpO2 99 %.  Patient Vitals for the past 24 hrs:  BP Temp Temp src Pulse Resp SpO2 Height Weight  01/26/21 1422  121/61 99.6 F (37.6 C) Oral 96 18 99 % -- --  01/26/21 1009 113/78 99 F (37.2 C) Oral 96 16 100 % _1  (1.575 m) 47.2 kg   Physical Exam Vitals and nursing note reviewed.  Constitutional:      General: She is not in acute distress.    Appearance: Normal appearance. She is not ill-appearing, toxic-appearing or diaphoretic.  HENT:     Head: Normocephalic and atraumatic.  Pulmonary:     Effort: Pulmonary effort is normal.  Neurological:     Mental Status: She is alert and oriented to person, place, and time.  Psychiatric:        Mood and Affect: Mood normal.        Behavior: Behavior normal.        Thought Content: Thought content normal.        Judgment: Judgment normal.    Results for orders placed or performed during the hospital encounter of 01/26/21 (from the past 24 hour(s))  Urinalysis, Routine w reflex microscopic Urine, Clean Catch     Status: Abnormal   Collection Time: 01/26/21 10:16 AM  Result Value Ref Range   Color, Urine YELLOW YELLOW   APPearance CLOUDY (A) CLEAR   Specific Gravity, Urine 1.026 1.005 - 1.030   pH 6.0 5.0 - 8.0   Glucose, UA NEGATIVE NEGATIVE mg/dL   Hgb urine dipstick NEGATIVE NEGATIVE   Bilirubin Urine NEGATIVE NEGATIVE   Ketones, ur 20 (A) NEGATIVE mg/dL   Protein, ur NEGATIVE NEGATIVE mg/dL   Nitrite NEGATIVE NEGATIVE   Leukocytes,Ua TRACE (A) NEGATIVE   RBC / HPF 0-5 0 - 5 RBC/hpf   WBC, UA 6-10 0 - 5 WBC/hpf   Bacteria, UA FEW (A) NONE SEEN   Squamous Epithelial / LPF 11-20 0 - 5   Mucus PRESENT   Pregnancy, urine POC     Status: Abnormal   Collection Time: 01/26/21 10:23 AM  Result Value Ref Range   Preg Test, Ur POSITIVE (A) NEGATIVE  CBC     Status: None   Collection Time: 01/26/21 10:41 AM  Result Value Ref Range   WBC 6.5 4.0 - 10.5 K/uL   RBC 4.45 3.87 - 5.11 MIL/uL   Hemoglobin 13.5 12.0 - 15.0 g/dL   HCT 41.1 36.0 - 46.0 %   MCV 92.4 80.0 - 100.0 fL   MCH 30.3 26.0 - 34.0 pg   MCHC 32.8 30.0 - 36.0 g/dL   RDW 12.2  11.5 - 15.5 %   Platelets 154 150 - 400 K/uL   nRBC 0.0 0.0 - 0.2 %  Comprehensive metabolic panel     Status: Abnormal   Collection Time: 01/26/21 10:41 AM  Result Value Ref Range   Sodium 133 (L) 135 - 145 mmol/L   Potassium 4.2 3.5 - 5.1 mmol/L  Chloride 103 98 - 111 mmol/L   CO2 23 22 - 32 mmol/L   Glucose, Bld 93 70 - 99 mg/dL   BUN 5 (L) 6 - 20 mg/dL   Creatinine, Ser 0.59 0.44 - 1.00 mg/dL   Calcium 9.4 8.9 - 10.3 mg/dL   Total Protein 7.1 6.5 - 8.1 g/dL   Albumin 3.9 3.5 - 5.0 g/dL   AST 21 15 - 41 U/L   ALT 17 0 - 44 U/L   Alkaline Phosphatase 28 (L) 38 - 126 U/L   Total Bilirubin 0.6 0.3 - 1.2 mg/dL   GFR, Estimated >60 >60 mL/min   Anion gap 7 5 - 15  hCG, quantitative, pregnancy     Status: Abnormal   Collection Time: 01/26/21 10:41 AM  Result Value Ref Range   hCG, Beta Chain, Quant, S 225,932 (H) <5 mIU/mL  ABO/Rh     Status: None   Collection Time: 01/26/21 10:41 AM  Result Value Ref Range   ABO/RH(D) A POS    No rh immune globuloin      NOT A RH IMMUNE GLOBULIN CANDIDATE, PT RH POSITIVE Performed at [facility] [address], [place], [place] [address]   Wet prep, genital     Status: Abnormal   Collection Time: 01/26/21 11:06 AM   Specimen: Vaginal  Result Value Ref Range   Yeast Wet Prep HPF POC NONE SEEN NONE SEEN   Trich, Wet Prep NONE SEEN NONE SEEN   Clue Cells Wet Prep HPF POC PRESENT (A) NONE SEEN   WBC, Wet Prep HPF POC FEW (A) NONE SEEN   Sperm NONE SEEN    US OB Comp Less 14 Wks  Result Date: 01/26/2021 CLINICAL DATA:  Vaginal bleeding with positive pregnancy test. EXAM: OBSTETRIC <14 WK [place] AND TRANSVAGINAL OB US TECHNIQUE: Both transabdominal and transvaginal ultrasound examinations were performed for complete evaluation of the gestation as well as the maternal uterus, adnexal regions, and pelvic cul-de-sac. Transvaginal technique was performed to assess early pregnancy. COMPARISON:  None. FINDINGS: Intrauterine gestational sac:  Single. Yolk sac:  Visualized. Embryo:  Visualized. Cardiac Activity: Visualized. Heart Rate: 157 bpm CRL: 12.7 mm   7 w   3 d                  [place] EDC: 09/11/2021 Subchorionic hemorrhage:  None visualized. Maternal uterus/adnexae: Unremarkable. IMPRESSION: Single living intrauterine gestation at estimated 7 week 3 day gestational age by crown-rump length. Electronically Signed   By: Misty Stanley M.D.   On: 01/26/2021 12:35   MAU Course  Procedures  MDM -r/o ectopic -[place]: single IUP, + yolk sac, +embryo, +FHR 157, [redacted]w[redacted]d-hCG: 2580,998-ABO: A Positive -WetPrep: +ClueCells (isolated finding not requiring treatment) -GC/CT collected -pt discharged to home in stable condition  -N/V in pregnancy -UA: cloudy/20ketones/trace leuks/few bacteria, sending urine for culture -CBC: WNL -CMP: no abnormalities requiring treatment -1L LR + 24mPepcid + 1018meglan given, pt reports NV now resolved -pt able to urinate after fluids -PO challenge successful -pt discharged to home in stable condition  Orders Placed This Encounter  Procedures  . Wet prep, genital    Standing Status:   Standing    Number of Occurrences:   1  . Culture, OB Urine    Standing Status:   Standing    Number of Occurrences:   1  . US [place] Comp Less 14 Wks    Standing Status:   Standing  Number of Occurrences:   1    Order Specific Question:   Symptom/Reason for Exam    Answer:   Abdominal cramping [[id number]]  . Urinalysis, Routine w reflex microscopic Urine, Clean Catch    Standing Status:   Standing    Number of Occurrences:   1  . CBC    Standing Status:   Standing    Number of Occurrences:   1  . Comprehensive metabolic panel    Standing Status:   Standing    Number of Occurrences:   1  . hCG, quantitative, pregnancy    Standing Status:   Standing    Number of Occurrences:   1  . Pregnancy, urine POC    Standing Status:   Standing    Number of Occurrences:   1  . ABO/Rh    Standing Status:   Standing     Number of Occurrences:   1  . Insert peripheral IV    Standing Status:   Standing    Number of Occurrences:   1  . Discharge patient    Order Specific Question:   Discharge disposition    Answer:   01-Home or Self Care [1]    Order Specific Question:   Discharge patient date    Answer:   01/26/2021   Meds ordered this encounter  Medications  . lactated ringers bolus 1,000 mL  . famotidine (PEPCID) IVPB 20 mg premix  . metoCLOPramide (REGLAN) injection 10 mg  . famotidine (PEPCID) 20 MG tablet    Sig: Take 1 tablet (20 mg total) by mouth daily.    Dispense:  30 tablet    Refill:  1    Order Specific Question:   Supervising Provider    Answer:   Aletha Halim [id number]  . metoCLOPramide (REGLAN) 10 MG tablet    Sig: Take 1 tablet (10 mg total) by mouth 3 (three) times daily with meals.    Dispense:  90 tablet    Refill:  1    Order Specific Question:   Supervising Provider    Answer:   Aletha Halim [[id number]]   Assessment and Plan   1. Nausea and vomiting in pregnancy   2. Abdominal cramping   3. [redacted] weeks gestation of pregnancy     Allergies as of 01/26/2021   No Known Allergies     Medication List    TAKE these medications   Albuterol Sulfate 108 (90 Base) MCG/ACT Aepb Commonly known as: ProAir RespiClick Inhale 2 puffs into the lungs every 4 (four) hours as needed (cough and wheezing).   famotidine 20 MG tablet Commonly known as: Pepcid Take 1 tablet (20 mg total) by mouth daily.   metoCLOPramide 10 MG tablet Commonly known as: REGLAN Take 1 tablet (10 mg total) by mouth 3 (three) times daily with meals.   multivitamin-prenatal 27-0.8 MG Tabs tablet Take 1 tablet by mouth daily at 12 noon.      -will call with culture results, if positive -safe meds in pregnancy list given -list of OB providers given -return MAU precautions given -pt discharged to home in stable condition  Elmyra Ricks E Garrie Woodin 01/26/2021, 3:03 PM

## 2021-01-27 LAB — CULTURE, OB URINE: Culture: >=100000 — AB

## 2021-01-27 LAB — GC/CHLAMYDIA PROBE AMP (~~LOC~~) NOT AT ARMC
Chlamydia: NEGATIVE
Comment: NEGATIVE
Comment: NORMAL
Neisseria Gonorrhea: NEGATIVE

## 2021-01-28 ENCOUNTER — Other Ambulatory Visit: Payer: Self-pay | Admitting: Certified Nurse Midwife

## 2021-01-28 ENCOUNTER — Telehealth: Payer: Self-pay | Admitting: Certified Nurse Midwife

## 2021-01-28 ENCOUNTER — Encounter: Payer: Self-pay | Admitting: Certified Nurse Midwife

## 2021-01-28 DIAGNOSIS — O2341 Unspecified infection of urinary tract in pregnancy, first trimester: Secondary | ICD-10-CM

## 2021-01-28 DIAGNOSIS — R8271 Bacteriuria: Secondary | ICD-10-CM | POA: Insufficient documentation

## 2021-01-28 DIAGNOSIS — B951 Streptococcus, group B, as the cause of diseases classified elsewhere: Secondary | ICD-10-CM

## 2021-01-28 MED ORDER — AMOXICILLIN 500 MG PO CAPS: 500.0000 mg | ORAL_CAPSULE | Freq: Three times a day (TID) | ORAL | 0 refills | Status: DC

## 2021-01-28 NOTE — Telephone Encounter (Signed)
+  GBS UTI, Pt reports no sx. Rx sent.

## 2021-04-01 ENCOUNTER — Telehealth (INDEPENDENT_AMBULATORY_CARE_PROVIDER_SITE_OTHER): Payer: BC Managed Care – PPO

## 2021-04-01 DIAGNOSIS — Z3A00 Weeks of gestation of pregnancy not specified: Secondary | ICD-10-CM

## 2021-04-01 DIAGNOSIS — R8271 Bacteriuria: Secondary | ICD-10-CM

## 2021-04-01 DIAGNOSIS — Z3480 Encounter for supervision of other normal pregnancy, unspecified trimester: Secondary | ICD-10-CM | POA: Insufficient documentation

## 2021-04-01 NOTE — Progress Notes (Signed)
New OB Intake  I connected with  April Ramos on 04/01/21 at  3:15 PM EDT by MyChart Video Visit and verified that I am speaking with the correct person using two identifiers. Nurse is located at [facility] and pt is located at home.  I discussed the limitations, risks, security and privacy concerns of performing an evaluation and management service by telephone and the availability of in person appointments. I also discussed with the patient that there may be a patient responsible charge related to this service. The patient expressed understanding and agreed to proceed.  I explained I am completing New OB Intake today. We discussed her EDD of 09/11/21 that is based on U/S on 01/26/21 @ [redacted]w[redacted]d. Pt is G1/P0. I reviewed her allergies, medications, Medical/Surgical/OB history, and appropriate screenings. I informed her of [facility] services. Based on history, this is a/an  pregnancy uncomplicated .   Patient Active Problem List   Diagnosis Date Noted   GBS bacteriuria 01/28/2021    Concerns addressed today  Delivery Plans:  Plans to deliver at [facility].   MyChart/Babyscripts MyChart access verified. I explained pt will have some visits in office and some virtually. Babyscripts instructions given and order placed. Patient verifies receipt of registration text/e-mail. Account successfully created and app downloaded.  Blood Pressure Cuff  Patient has private insurance; instructed to purchase blood pressure cuff and bring to first prenatal appt. Explained after first prenatal appt pt will check weekly and document in Babyscripts.  Weight scale: Patient    have weight scale. Weight scale ordered   Anatomy US Explained first scheduled [place] will be around 19 weeks. Anatomy US scheduled for 04/23/21 at 07:45a. Pt notified to arrive at 07:30a.  Labs Discussed Avelina Laine genetic screening with patient. Would like both Panorama and Horizon drawn at new OB visit. Routine prenatal labs needed.  Covid  Vaccine Patient has not covid vaccine.   Mother/ Baby Dyad Candidate?    If yes, offer as possibility  Inform patient of [facility] . In AVS   Social Determinants of Health Food Insecurity: Patient denies food insecurity. WIC Referral: Patient is interested in referral to [facility]  Transportation: Patient denies transportation needs. Childcare: Discussed no children allowed at ultrasound appointments. Offered childcare services; patient declines childcare services at this time.  First visit review I reviewed new OB appt with pt. I explained she will have a pelvic exam, ob bloodwork with genetic screening, and PAP smear. Explained pt will be seen by Dr.Constant at first visit; encounter routed to appropriate provider. Explained that patient will be seen by pregnancy navigator following visit with provider. [facility] information placed in AVS.   Henrietta Dine, CMA 04/01/2021  3:40 PM

## 2021-04-15 ENCOUNTER — Other Ambulatory Visit (HOSPITAL_COMMUNITY)
Admission: RE | Admit: 2021-04-15 | Discharge: 2021-04-15 | Disposition: A | Discharge status: Home / Self Care | Payer: BC Managed Care – PPO | Source: Ambulatory Visit | Attending: Obstetrics and Gynecology | Admitting: Obstetrics and Gynecology

## 2021-04-15 ENCOUNTER — Other Ambulatory Visit: Payer: Self-pay

## 2021-04-15 ENCOUNTER — Ambulatory Visit (INDEPENDENT_AMBULATORY_CARE_PROVIDER_SITE_OTHER): Payer: BC Managed Care – PPO | Admitting: Obstetrics and Gynecology

## 2021-04-15 ENCOUNTER — Encounter: Payer: Self-pay | Admitting: Obstetrics and Gynecology

## 2021-04-15 VITALS — BP 118/73 | HR 96 | Wt 134.7 lb

## 2021-04-15 DIAGNOSIS — Z3480 Encounter for supervision of other normal pregnancy, unspecified trimester: Secondary | ICD-10-CM | POA: Insufficient documentation

## 2021-04-15 DIAGNOSIS — N898 Other specified noninflammatory disorders of vagina: Secondary | ICD-10-CM

## 2021-04-15 NOTE — Patient Instructions (Signed)
Second Trimester of Pregnancy °The second trimester of pregnancy is from week 13 through week 27. This is months 4 through 6 of pregnancy. The second trimester is often a time when you feel your best. Your body has adjusted to being pregnant, and you begin to feel better physically. °During the second trimester: °Morning sickness has lessened or stopped completely. °You may have more energy. °You may have an increase in appetite. °The second trimester is also a time when the unborn baby (fetus) is growing rapidly. At the end of the sixth month, the fetus may be up to 12 inches long and weigh about 1½ pounds. You will likely begin to feel the baby move (quickening) between 16 and 20 weeks of pregnancy. °Body changes during your second trimester °Your body continues to go through many changes during your second trimester. The changes vary and generally return to normal after the baby is born. °Physical changes °Your weight will continue to increase. You will notice your lower abdomen bulging out. °You may begin to get stretch marks on your hips, abdomen, and breasts. °Your breasts will continue to grow and to become tender. °Dark spots or blotches (chloasma or mask of pregnancy) may develop on your face. °A dark line from your belly button to the pubic area (linea nigra) may appear. °You may have changes in your hair. These can include thickening of your hair, rapid growth, and changes in texture. Some people also have hair loss during or after pregnancy, or hair that feels dry or thin. °Health changes °You may develop headaches. °You may have heartburn. °You may develop constipation. °You may develop hemorrhoids or swollen, bulging veins (varicose veins). °Your gums may bleed and may be sensitive to brushing and flossing. °You may urinate more often because the fetus is pressing on your bladder. °You may have back pain. This is caused by: °Weight gain. °Pregnancy hormones that are relaxing the joints in your  pelvis. °A shift in weight and the muscles that support your balance. °Follow these instructions at home: °Medicines °Follow your health care provider's instructions regarding medicine use. Specific medicines may be either safe or unsafe to take during pregnancy. Do not take any medicines unless approved by your health care provider. °Take a prenatal vitamin that contains at least 600 micrograms (mcg) of folic acid. °Eating and drinking °Eat a healthy diet that includes fresh fruits and vegetables, whole grains, good sources of protein such as meat, eggs, or tofu, and low-fat dairy products. °Avoid raw meat and unpasteurized juice, milk, and cheese. These carry germs that can harm you and your baby. °You may need to take these actions to prevent or treat constipation: °Drink enough fluid to keep your urine pale yellow. °Eat foods that are high in fiber, such as beans, whole grains, and fresh fruits and vegetables. °Limit foods that are high in fat and processed sugars, such as fried or sweet foods. °Activity °Exercise only as directed by your health care provider. Most people can continue their usual exercise routine during pregnancy. Try to exercise for 30 minutes at least 5 days a week. Stop exercising if you develop contractions in your uterus. °Stop exercising if you develop pain or cramping in the lower abdomen or lower back. °Avoid exercising if it is very hot or humid or if you are at a high altitude. °Avoid heavy lifting. °If you choose to, you may have sex unless your health care provider tells you not to. °Relieving pain and discomfort °Wear a supportive bra   to prevent discomfort from breast tenderness. °Take warm sitz baths to soothe any pain or discomfort caused by hemorrhoids. Use hemorrhoid cream if your health care provider approves. °Rest with your legs raised (elevated) if you have leg cramps or low back pain. °If you develop varicose veins: °Wear support hose as told by your health care  provider. °Elevate your feet for 15 minutes, 3-4 times a day. °Limit salt in your diet. °Safety °Wear your seat belt at all times when driving or riding in a car. °Talk with your health care provider if someone is verbally or physically abusive to you. °Lifestyle °Do not use hot tubs, steam rooms, or saunas. °Do not douche. Do not use tampons or scented sanitary pads. °Avoid cat litter boxes and soil used by cats. These carry germs that can cause birth defects in the baby and possibly loss of the fetus by miscarriage or stillbirth. °Do not use herbal remedies, alcohol, illegal drugs, or medicines that are not approved by your health care provider. Chemicals in these products can harm your baby. °Do not use any products that contain nicotine or tobacco, such as cigarettes, e-cigarettes, and chewing tobacco. If you need help quitting, ask your health care provider. °General instructions °During a routine prenatal visit, your health care provider will do a physical exam and other tests. He or she will also discuss your overall health. Keep all follow-up visits. This is important. °Ask your health care provider for a referral to a local prenatal education class. °Ask for help if you have counseling or nutritional needs during pregnancy. Your health care provider can offer advice or refer you to specialists for help with various needs. °Where to find more information °American Pregnancy Association: americanpregnancy.org °American College of Obstetricians and Gynecologists: acog.org/en/Womens%20Health/Pregnancy °Office on Women's Health: womenshealth.gov/pregnancy °Contact a health care provider if you have: °A headache that does not go away when you take medicine. °Vision changes or you see spots in front of your eyes. °Mild pelvic cramps, pelvic pressure, or nagging pain in the abdominal area. °Persistent nausea, vomiting, or diarrhea. °A bad-smelling vaginal discharge or foul-smelling urine. °Pain when you  urinate. °Sudden or extreme swelling of your face, hands, ankles, feet, or legs. °A fever. °Get help right away if you: °Have fluid leaking from your vagina. °Have spotting or bleeding from your vagina. °Have severe abdominal cramping or pain. °Have difficulty breathing. °Have chest pain. °Have fainting spells. °Have not felt your baby move for the time period told by your health care provider. °Have new or increased pain, swelling, or redness in an arm or leg. °Summary °The second trimester of pregnancy is from week 13 through week 27 (months 4 through 6). °Do not use herbal remedies, alcohol, illegal drugs, or medicines that are not approved by your health care provider. Chemicals in these products can harm your baby. °Exercise only as directed by your health care provider. Most people can continue their usual exercise routine during pregnancy. °Keep all follow-up visits. This is important. °This information is not intended to replace advice given to you by your health care provider. Make sure you discuss any questions you have with your health care provider. °Document Revised: 02/26/2020 Document Reviewed: 01/02/2020 °Elsevier Patient Education © 2022 Elsevier Inc. ° °Contraception Choices °Contraception, also called birth control, refers to methods or devices that prevent pregnancy. °Hormonal methods °Contraceptive implant °A contraceptive implant is a thin, plastic tube that contains a hormone that prevents pregnancy. It is different from an intrauterine device (IUD). It   is inserted into the upper part of the arm by a health care provider. Implants can be effective for up to 3 years. °Progestin-only injections °Progestin-only injections are injections of progestin, a synthetic form of the hormone progesterone. They are given every 3 months by a health care provider. °Birth control pills °Birth control pills are pills that contain hormones that prevent pregnancy. They must be taken once a day, preferably at the  same time each day. A prescription is needed to use this method of contraception. °Birth control patch °The birth control patch contains hormones that prevent pregnancy. It is placed on the skin and must be changed once a week for three weeks and removed on the fourth week. A prescription is needed to use this method of contraception. °Vaginal ring °A vaginal ring contains hormones that prevent pregnancy. It is placed in the vagina for three weeks and removed on the fourth week. After that, the process is repeated with a new ring. A prescription is needed to use this method of contraception. °Emergency contraceptive °Emergency contraceptives prevent pregnancy after unprotected sex. They come in pill form and can be taken up to 5 days after sex. They work best the sooner they are taken after having sex. Most emergency contraceptives are available without a prescription. This method should not be used as your only form of birth control. °Barrier methods °Female condom °A female condom is a thin sheath that is worn over the penis during sex. Condoms keep sperm from going inside a woman's body. They can be used with a sperm-killing substance (spermicide) to increase their effectiveness. They should be thrown away after one use. °Female condom °A female condom is a soft, loose-fitting sheath that is put into the vagina before sex. The condom keeps sperm from going inside a woman's body. They should be thrown away after one use. °Diaphragm °A diaphragm is a soft, dome-shaped barrier. It is inserted into the vagina before sex, along with a spermicide. The diaphragm blocks sperm from entering the uterus, and the spermicide kills sperm. A diaphragm should be left in the vagina for 6-8 hours after sex and removed within 24 hours. °A diaphragm is prescribed and fitted by a health care provider. A diaphragm should be replaced every 1-2 years, after giving birth, after gaining more than 15 lb (6.8 kg), and after pelvic  surgery. °Cervical cap °A cervical cap is a round, soft latex or plastic cup that fits over the cervix. It is inserted into the vagina before sex, along with spermicide. It blocks sperm from entering the uterus. The cap should be left in place for 6-8 hours after sex and removed within 48 hours. A cervical cap must be prescribed and fitted by a health care provider. It should be replaced every 2 years. °Sponge °A sponge is a soft, circular piece of polyurethane foam with spermicide in it. The sponge helps block sperm from entering the uterus, and the spermicide kills sperm. To use it, you make it wet and then insert it into the vagina. It should be inserted before sex, left in for at least 6 hours after sex, and removed and thrown away within 30 hours. °Spermicides °Spermicides are chemicals that kill or block sperm from entering the cervix and uterus. They can come as a cream, jelly, suppository, foam, or tablet. A spermicide should be inserted into the vagina with an applicator at least 10-15 minutes before sex to allow time for it to work. The process must be repeated every time   you have sex. Spermicides do not require a prescription. °Intrauterine contraception °Intrauterine device (IUD) °An IUD is a T-shaped device that is put in a woman's uterus. There are two types: °Hormone IUD.This type contains progestin, a synthetic form of the hormone progesterone. This type can stay in place for 3-5 years. °Copper IUD.This type is wrapped in copper wire. It can stay in place for 10 years. °Permanent methods of contraception °Female tubal ligation °In this method, a woman's fallopian tubes are sealed, tied, or blocked during surgery to prevent eggs from traveling to the uterus. °Hysteroscopic sterilization °In this method, a small, flexible insert is placed into each fallopian tube. The inserts cause scar tissue to form in the fallopian tubes and block them, so sperm cannot reach an egg. The procedure takes about 3  months to be effective. Another form of birth control must be used during those 3 months. °Female sterilization °This is a procedure to tie off the tubes that carry sperm (vasectomy). After the procedure, the man can still ejaculate fluid (semen). Another form of birth control must be used for 3 months after the procedure. °Natural planning methods °Natural family planning °In this method, a couple does not have sex on days when the woman could become pregnant. °Calendar method °In this method, the woman keeps track of the length of each menstrual cycle, identifies the days when pregnancy can happen, and does not have sex on those days. °Ovulation method °In this method, a couple avoids sex during ovulation. °Symptothermal method °This method involves not having sex during ovulation. The woman typically checks for ovulation by watching changes in her temperature and in the consistency of cervical mucus. °Post-ovulation method °In this method, a couple waits to have sex until after ovulation. °Where to find more information °Centers for Disease Control and Prevention: www.cdc.gov °Summary °Contraception, also called birth control, refers to methods or devices that prevent pregnancy. °Hormonal methods of contraception include implants, injections, pills, patches, vaginal rings, and emergency contraceptives. °Barrier methods of contraception can include female condoms, female condoms, diaphragms, cervical caps, sponges, and spermicides. °There are two types of IUDs (intrauterine devices). An IUD can be put in a woman's uterus to prevent pregnancy for 3-5 years. °Permanent sterilization can be done through a procedure for males and females. Natural family planning methods involve nothaving sex on days when the woman could become pregnant. °This information is not intended to replace advice given to you by your health care provider. Make sure you discuss any questions you have with your health care provider. °Document  Revised: 02/24/2020 Document Reviewed: 02/24/2020 °Elsevier Patient Education © 2022 Elsevier Inc. ° °

## 2021-04-15 NOTE — Progress Notes (Signed)
  Subjective:    April Ramos is a G1P0 [redacted]w[redacted]d being seen today for her first obstetrical visit.  Her obstetrical history is significant for first pregnancy. Patient does intend to breast feed. Pregnancy history fully reviewed.  Patient reports no complaints.  Vitals:   04/15/21 1410  BP: 118/73  Pulse: 96  Weight: 134 lb 11.2 oz (61.1 kg)    HISTORY: OB History  Gravida Para Term Preterm AB Living  1            SAB IAB Ectopic Multiple Live Births               # Outcome Date GA Lbr Len/2nd Weight Sex Delivery Anes PTL Lv  1 Current            Past Medical History:  Diagnosis Date  . Medical history non-contributory    Past Surgical History:  Procedure Laterality Date  . NO PAST SURGERIES     Family History  Problem Relation Age of Onset  . Fibroids Mother   . Anemia Mother   . Diabetes Father      Exam    Uterus:     Pelvic Exam:    Perineum: No Hemorrhoids, Normal Perineum   Vulva: normal   Vagina:  normal mucosa, normal discharge   pH:    Cervix: nulliparous appearance and cervix is closed and long   Adnexa: no mass, fullness, tenderness   Bony Pelvis: gynecoid  System: Breast:  normal appearance, no masses or tenderness   Skin: normal coloration and turgor, no rashes    Neurologic: oriented, no focal deficits   Extremities: normal strength, tone, and muscle mass   HEENT extra ocular movement intact   Mouth/Teeth mucous membranes moist, pharynx normal without lesions and dental hygiene good   Neck supple and no masses   Cardiovascular: regular rate and rhythm   Respiratory:  appears well, vitals normal, no respiratory distress, acyanotic, normal RR, neck free of mass or lymphadenopathy, chest clear, no wheezing, crepitations, rhonchi, normal symmetric air entry   Abdomen: soft, non-tender; bowel sounds normal; no masses,  no organomegaly   Urinary:       Assessment:    Pregnancy: G1P0 Patient Active Problem List   Diagnosis Date Noted  .  Supervision of other normal pregnancy, antepartum 04/01/2021  . GBS bacteriuria 01/28/2021        Plan:     Initial labs drawn. Prenatal vitamins. Problem list reviewed and updated. Genetic Screening discussed : Panorama, AFP and Horizon today  Ultrasound discussed; fetal survey: ordered.  Follow up in 4 weeks. 50% of 30 min visit spent on counseling and coordination of care.     Mycheal Veldhuizen 04/15/2021

## 2021-04-16 LAB — CERVICOVAGINAL ANCILLARY ONLY
Bacterial Vaginitis (gardnerella): POSITIVE — AB
Candida Glabrata: NEGATIVE
Candida Vaginitis: NEGATIVE
Chlamydia: NEGATIVE
Comment: NEGATIVE
Comment: NEGATIVE
Comment: NEGATIVE
Comment: NEGATIVE
Comment: NEGATIVE
Comment: NORMAL
Neisseria Gonorrhea: NEGATIVE
Trichomonas: NEGATIVE

## 2021-04-17 MED ORDER — METRONIDAZOLE 500 MG PO TABS: 500.0000 mg | ORAL_TABLET | Freq: Two times a day (BID) | ORAL | 0 refills | Status: DC

## 2021-04-17 NOTE — Addendum Note (Signed)
Addended by: Catalina Antigua on: 04/17/2021 01:27 PM   Modules accepted: Orders

## 2021-04-20 LAB — CYTOLOGY - PAP
Comment: NEGATIVE
Diagnosis: NEGATIVE
High risk HPV: NEGATIVE

## 2021-04-23 ENCOUNTER — Other Ambulatory Visit: Payer: Self-pay | Admitting: *Deleted

## 2021-04-23 ENCOUNTER — Ambulatory Visit: Payer: BC Managed Care – PPO | Attending: Obstetrics and Gynecology

## 2021-04-23 ENCOUNTER — Other Ambulatory Visit: Payer: Self-pay

## 2021-04-23 DIAGNOSIS — Z3480 Encounter for supervision of other normal pregnancy, unspecified trimester: Secondary | ICD-10-CM | POA: Diagnosis not present

## 2021-04-23 DIAGNOSIS — Z362 Encounter for other antenatal screening follow-up: Secondary | ICD-10-CM

## 2021-04-24 LAB — HCV INTERPRETATION

## 2021-04-24 LAB — CBC/D/PLT+RPR+RH+ABO+RUBIGG...
Antibody Screen: NEGATIVE
Basophils Absolute: 0 10*3/uL (ref 0.0–0.2)
Basos: 0 %
EOS (ABSOLUTE): 0.1 10*3/uL (ref 0.0–0.4)
Eos: 1 %
HCV Ab: <0.1 s/co ratio (ref 0.0–0.9)
HIV Screen 4th Generation wRfx: NONREACTIVE
Hematocrit: 31.8 % — ABNORMAL LOW (ref 34.0–46.6)
Hemoglobin: 10.5 g/dL — ABNORMAL LOW (ref 11.1–15.9)
Hepatitis B Surface Ag: NEGATIVE
Immature Grans (Abs): 0.1 10*3/uL (ref 0.0–0.1)
Immature Granulocytes: 1 %
Lymphocytes Absolute: 2.2 10*3/uL (ref 0.7–3.1)
Lymphs: 21 %
MCH: 30.3 pg (ref 26.6–33.0)
MCHC: 33 g/dL (ref 31.5–35.7)
MCV: 92 fL (ref 79–97)
Monocytes Absolute: 0.7 10*3/uL (ref 0.1–0.9)
Monocytes: 7 %
Neutrophils Absolute: 7.4 10*3/uL — ABNORMAL HIGH (ref 1.4–7.0)
Neutrophils: 70 %
Platelets: 192 10*3/uL (ref 150–450)
RBC: 3.46 x10E6/uL — ABNORMAL LOW (ref 3.77–5.28)
RDW: 13.5 % (ref 11.7–15.4)
RPR Ser Ql: NONREACTIVE
Rh Factor: POSITIVE
Rubella Antibodies, IGG: 1.83 index (ref >0.99)
WBC: 10.5 10*3/uL (ref 3.4–10.8)

## 2021-04-24 LAB — AFP, SERUM, OPEN SPINA BIFIDA
AFP MoM: 0.7
AFP Value: 41.5 ng/mL
Gest. Age on Collection Date: 18.7 weeks
Maternal Age At EDD: 22.9 yr
OSBR Risk 1 IN: 10000
Test Results:: NEGATIVE
Weight: 135 [lb_av]

## 2021-04-24 LAB — HEMOGLOBIN A1C
Est. average glucose Bld gHb Est-mCnc: 100 mg/dL
Hgb A1c MFr Bld: 5.1 % (ref 4.8–5.6)

## 2021-05-06 ENCOUNTER — Encounter: Payer: Self-pay | Admitting: *Deleted

## 2021-05-14 ENCOUNTER — Other Ambulatory Visit: Payer: Self-pay

## 2021-05-14 ENCOUNTER — Ambulatory Visit (INDEPENDENT_AMBULATORY_CARE_PROVIDER_SITE_OTHER): Payer: BC Managed Care – PPO | Admitting: Certified Nurse Midwife

## 2021-05-14 VITALS — BP 113/67 | HR 91 | Wt 139.2 lb

## 2021-05-14 DIAGNOSIS — Z3492 Encounter for supervision of normal pregnancy, unspecified, second trimester: Secondary | ICD-10-CM

## 2021-05-14 DIAGNOSIS — Z3A22 22 weeks gestation of pregnancy: Secondary | ICD-10-CM

## 2021-05-14 NOTE — Progress Notes (Signed)
   PRENATAL VISIT NOTE  Subjective:  April Ramos is a 20 y.o. G1P0 at [redacted]w[redacted]d being seen today for ongoing prenatal care.  She is currently monitored for the following issues for this low-risk pregnancy and has GBS bacteriuria and Supervision of other normal pregnancy, antepartum on their problem list.  Patient reports no complaints.  Contractions: Not present. Vag. Bleeding: None.  Movement: Present. Denies leaking of fluid.   The following portions of the patient's history were reviewed and updated as appropriate: allergies, current medications, past family history, past medical history, past social history, past surgical history and problem list.   Objective:   Vitals:   05/14/21 0916  BP: 113/67  Pulse: 91  Weight: 139 lb 3.2 oz (63.1 kg)    Fetal Status: Fetal Heart Rate (bpm): 154 Fundal Height: 22 cm Movement: Present     General:  Alert, oriented and cooperative. Patient is in no acute distress.  Skin: Skin is warm and dry. No rash noted.   Cardiovascular: Normal heart rate noted  Respiratory: Normal respiratory effort, no problems with respiration noted  Abdomen: Soft, gravid, appropriate for gestational age.  Pain/Pressure: Present     Pelvic: Cervical exam deferred        Extremities: Normal range of motion.  Edema: Trace  Mental Status: Normal mood and affect. Normal behavior. Normal judgment and thought content.   Assessment and Plan:  Pregnancy: G1P0 at [redacted]w[redacted]d 1. Supervision of low-risk pregnancy, second trimester - Doing well overall, beginning to feel regular and vigorous fetal movement. Given reassurance about safety of anterior placement of placenta  2. [redacted] weeks gestation of pregnancy - Routine OB care - Anticipatory guidance given re GTT at next visit  Preterm labor symptoms and general obstetric precautions including but not limited to vaginal bleeding, contractions, leaking of fluid and fetal movement were reviewed in detail with the patient. Please refer  to After Visit Summary for other counseling recommendations.   Return in about 4 weeks (around 06/11/2021) for IN-PERSON, LOB/GTT.  Future Appointments  Date Time Provider Department Center  05/21/2021 10:15 AM [facility] NURSE [facility]  05/21/2021 10:30 AM [facility] US3 [facility]  06/11/2021 10:35 AM Bernerd Limbo, CNM [facility]    Bernerd Limbo, CNM

## 2021-05-21 ENCOUNTER — Ambulatory Visit: Payer: BC Managed Care – PPO

## 2021-06-01 ENCOUNTER — Other Ambulatory Visit: Payer: Self-pay

## 2021-06-01 ENCOUNTER — Ambulatory Visit: Payer: BC Managed Care – PPO | Attending: Obstetrics | Admitting: *Deleted

## 2021-06-01 ENCOUNTER — Ambulatory Visit (HOSPITAL_BASED_OUTPATIENT_CLINIC_OR_DEPARTMENT_OTHER): Payer: BC Managed Care – PPO

## 2021-06-01 ENCOUNTER — Encounter: Payer: Self-pay | Admitting: *Deleted

## 2021-06-01 VITALS — BP 126/59 | HR 88

## 2021-06-01 DIAGNOSIS — Z362 Encounter for other antenatal screening follow-up: Secondary | ICD-10-CM | POA: Diagnosis present

## 2021-06-01 DIAGNOSIS — Z3A25 25 weeks gestation of pregnancy: Secondary | ICD-10-CM | POA: Diagnosis not present

## 2021-06-01 DIAGNOSIS — Z3480 Encounter for supervision of other normal pregnancy, unspecified trimester: Secondary | ICD-10-CM

## 2021-06-01 DIAGNOSIS — R8271 Bacteriuria: Secondary | ICD-10-CM

## 2021-06-11 ENCOUNTER — Other Ambulatory Visit: Payer: Self-pay

## 2021-06-11 ENCOUNTER — Ambulatory Visit (INDEPENDENT_AMBULATORY_CARE_PROVIDER_SITE_OTHER): Payer: BC Managed Care – PPO | Admitting: Certified Nurse Midwife

## 2021-06-11 ENCOUNTER — Other Ambulatory Visit: Payer: BC Managed Care – PPO

## 2021-06-11 VITALS — BP 124/84 | HR 86 | Wt 143.0 lb

## 2021-06-11 DIAGNOSIS — Z3A26 26 weeks gestation of pregnancy: Secondary | ICD-10-CM

## 2021-06-11 DIAGNOSIS — Z3492 Encounter for supervision of normal pregnancy, unspecified, second trimester: Secondary | ICD-10-CM

## 2021-06-12 LAB — CBC
Hematocrit: 33 % — ABNORMAL LOW (ref 34.0–46.6)
Hemoglobin: 11.3 g/dL (ref 11.1–15.9)
MCH: 30.8 pg (ref 26.6–33.0)
MCHC: 34.2 g/dL (ref 31.5–35.7)
MCV: 90 fL (ref 79–97)
Platelets: 193 10*3/uL (ref 150–450)
RBC: 3.67 x10E6/uL — ABNORMAL LOW (ref 3.77–5.28)
RDW: 12.1 % (ref 11.7–15.4)
WBC: 10.2 10*3/uL (ref 3.4–10.8)

## 2021-06-12 LAB — GLUCOSE TOLERANCE, 2 HOURS W/ 1HR
Glucose, 1 hour: 135 mg/dL (ref 65–179)
Glucose, 2 hour: 130 mg/dL (ref 65–152)
Glucose, Fasting: 83 mg/dL (ref 65–91)

## 2021-06-12 LAB — RPR: RPR Ser Ql: NONREACTIVE

## 2021-06-12 LAB — HIV ANTIBODY (ROUTINE TESTING W REFLEX): HIV Screen 4th Generation wRfx: NONREACTIVE

## 2021-06-14 NOTE — Progress Notes (Signed)
   PRENATAL VISIT NOTE  Subjective:  April Ramos is a 20 y.o. G1P0 at [redacted]w[redacted]d being seen today for ongoing prenatal care.  She is currently monitored for the following issues for this low-risk pregnancy and has GBS bacteriuria and Supervision of other normal pregnancy, antepartum on their problem list.  Patient reports no complaints.  Contractions: Not present. Vag. Bleeding: None.  Movement: Present. Denies leaking of fluid.   The following portions of the patient's history were reviewed and updated as appropriate: allergies, current medications, past family history, past medical history, past social history, past surgical history and problem list.   Objective:   Vitals:   06/11/21 1044  BP: 124/84  Pulse: 86  Weight: 143 lb (64.9 kg)    Fetal Status: Fetal Heart Rate (bpm): 150 Fundal Height: 26 cm Movement: Present     General:  Alert, oriented and cooperative. Patient is in no acute distress.  Skin: Skin is warm and dry. No rash noted.   Cardiovascular: Normal heart rate noted  Respiratory: Normal respiratory effort, no problems with respiration noted  Abdomen: Soft, gravid, appropriate for gestational age.  Pain/Pressure: Absent     Pelvic: Cervical exam deferred        Extremities: Normal range of motion.  Edema: Trace  Mental Status: Normal mood and affect. Normal behavior. Normal judgment and thought content.   Assessment and Plan:  Pregnancy: G1P0 at [redacted]w[redacted]d 1. Supervision of low-risk pregnancy, second trimester - Doing well, feeling regular and vigorous fetal movement  2. [redacted] weeks gestation of pregnancy - Routine OB care including GTT and 3rd trimester labs  Preterm labor symptoms and general obstetric precautions including but not limited to vaginal bleeding, contractions, leaking of fluid and fetal movement were reviewed in detail with the patient. Please refer to After Visit Summary for other counseling recommendations.   Return in about 2 weeks (around 06/25/2021)  for IN-PERSON, LOB.  Future Appointments  Date Time Provider Department Center  06/29/2021 10:55 AM Milas Hock, MD [facility]    Bernerd Limbo, CNM

## 2021-06-24 ENCOUNTER — Telehealth: Payer: Self-pay

## 2021-06-24 NOTE — Telephone Encounter (Signed)
Attempted to call pt. No answer and unable to leave VM, due to not set up.  Will send pt Mychart message.   Judeth Cornfield, RN

## 2021-06-24 NOTE — Telephone Encounter (Signed)
Patient called in wanting  Dr note stating that she needs to work a few less days. She's working 5 days straight now and it becoming to much she just wants to slow u a little and is needing documenting .. she is ok with the note going to her mychart also     Please call and advise

## 2021-06-27 NOTE — Progress Notes (Addendum)
   PRENATAL VISIT NOTE  Subjective:  April Ramos is a 20 y.o. G1P0 at [redacted]w[redacted]d being seen today for ongoing prenatal care.  She is currently monitored for the following issues for this low-risk pregnancy and has GBS bacteriuria; Supervision of other normal pregnancy, antepartum; and Abnormal cervical Papanicolaou smear on their problem list.  Patient reports no complaints.  Contractions: Not present. Vag. Bleeding: None.  Movement: Present. Denies leaking of fluid.   The following portions of the patient's history were reviewed and updated as appropriate: allergies, current medications, past family history, past medical history, past social history, past surgical history and problem list.   Objective:   Vitals:   06/29/21 1100  BP: 120/68  Pulse: 99  Weight: 143 lb 11.2 oz (65.2 kg)    Fetal Status: Fetal Heart Rate (bpm): 158 Fundal Height: 28 cm Movement: Present     General:  Alert, oriented and cooperative. Patient is in no acute distress.  Skin: Skin is warm and dry. No rash noted.   Cardiovascular: Normal heart rate noted  Respiratory: Normal respiratory effort, no problems with respiration noted  Abdomen: Soft, gravid, appropriate for gestational age.  Pain/Pressure: Absent     Pelvic: Cervical exam deferred        Extremities: Normal range of motion.  Edema: None  Mental Status: Normal mood and affect. Normal behavior. Normal judgment and thought content.   Assessment and Plan:  Pregnancy: G1P0 at [redacted]w[redacted]d 1. GBS bacteriuria - pcn in labor - no allergies  2. Supervision of other normal pregnancy, antepartum - anatomy completed - 28w labs last time wnl - reviewed with pt.  - Offered Tdap and flu - accepts tdap, declines flu.   - Work restrictions letter provided.   Preterm labor symptoms and general obstetric precautions including but not limited to vaginal bleeding, contractions, leaking of fluid and fetal movement were reviewed in detail with the patient. Please refer  to After Visit Summary for other counseling recommendations.   Return in about 2 weeks (around 07/13/2021) for OB VISIT, MD or APP.  No future appointments.   Milas Hock, MD

## 2021-06-29 ENCOUNTER — Encounter: Payer: Self-pay | Admitting: Obstetrics and Gynecology

## 2021-06-29 ENCOUNTER — Ambulatory Visit (INDEPENDENT_AMBULATORY_CARE_PROVIDER_SITE_OTHER): Payer: BC Managed Care – PPO | Admitting: Obstetrics and Gynecology

## 2021-06-29 ENCOUNTER — Other Ambulatory Visit: Payer: Self-pay

## 2021-06-29 VITALS — BP 120/68 | HR 99 | Wt 143.7 lb

## 2021-06-29 DIAGNOSIS — Z23 Encounter for immunization: Secondary | ICD-10-CM

## 2021-06-29 DIAGNOSIS — R8271 Bacteriuria: Secondary | ICD-10-CM

## 2021-06-29 DIAGNOSIS — Z3480 Encounter for supervision of other normal pregnancy, unspecified trimester: Secondary | ICD-10-CM | POA: Diagnosis not present

## 2021-06-29 NOTE — Addendum Note (Signed)
Addended by: Henrietta Dine on: 06/29/2021 11:33 AM   Modules accepted: Orders

## 2021-07-13 ENCOUNTER — Other Ambulatory Visit: Payer: Self-pay

## 2021-07-13 ENCOUNTER — Ambulatory Visit (INDEPENDENT_AMBULATORY_CARE_PROVIDER_SITE_OTHER): Payer: BC Managed Care – PPO | Admitting: Family Medicine

## 2021-07-13 ENCOUNTER — Other Ambulatory Visit (HOSPITAL_COMMUNITY)
Admission: RE | Admit: 2021-07-13 | Discharge: 2021-07-13 | Disposition: A | Discharge status: Home / Self Care | Payer: BC Managed Care – PPO | Source: Ambulatory Visit | Attending: Family Medicine | Admitting: Family Medicine

## 2021-07-13 VITALS — BP 126/61 | HR 98 | Wt 149.0 lb

## 2021-07-13 DIAGNOSIS — N898 Other specified noninflammatory disorders of vagina: Secondary | ICD-10-CM

## 2021-07-13 DIAGNOSIS — Z3480 Encounter for supervision of other normal pregnancy, unspecified trimester: Secondary | ICD-10-CM

## 2021-07-13 DIAGNOSIS — Z3A31 31 weeks gestation of pregnancy: Secondary | ICD-10-CM

## 2021-07-13 NOTE — Progress Notes (Signed)
   Subjective:  April Ramos is a 20 y.o. G1P0 at [redacted]w[redacted]d being seen today for ongoing prenatal care.  She is currently monitored for the following issues for this low-risk pregnancy and has GBS bacteriuria; Supervision of other normal pregnancy, antepartum; and Abnormal cervical Papanicolaou smear on their problem list.  Patient reports  thick white vaginal discharge .  Contractions: Not present.  .  Movement: Present. Denies leaking of fluid.   The following portions of the patient's history were reviewed and updated as appropriate: allergies, current medications, past family history, past medical history, past social history, past surgical history and problem list. Problem list updated.  Objective:   Vitals:   07/13/21 1103  BP: 126/61  Pulse: 98  Weight: 149 lb (67.6 kg)    Fetal Status: Fetal Heart Rate (bpm): 155 Fundal Height: 30 cm Movement: Present     General:  Alert, oriented and cooperative. Patient is in no acute distress.  Skin: Skin is warm and dry. No rash noted.   Cardiovascular: Normal heart rate noted  Respiratory: Normal respiratory effort, no problems with respiration noted  Abdomen: Soft, gravid, appropriate for gestational age. Pain/Pressure: Absent     Pelvic:       Cervical exam deferred        Extremities: Normal range of motion.  Edema: None  Mental Status: Normal mood and affect. Normal behavior. Normal judgment and thought content.   Assessment and Plan:  Pregnancy: G1P0 at [redacted]w[redacted]d  1. Supervision of other normal pregnancy, antepartum 2. [redacted] weeks gestation of pregnancy Doing well. Appropriate heart rate and fundal height. Reports having some back pain but able to find comfort with pregnancy pillow. Confirms she wants POPs for birth control. Still deciding about pediatrician - Follow up in 2 weeks - Cervicovaginal ancillary only( [place])  3. Vaginal discharge Vaginal discharge thick white started last week, some thick white discharge. Denies  vaginal bleeding or leaking of fluid. - Cervicovaginal ancillary only( )  Preterm labor symptoms and general obstetric precautions including but not limited to vaginal bleeding, contractions, leaking of fluid and fetal movement were reviewed in detail with the patient. Please refer to After Visit Summary for other counseling recommendations.  Return in about 2 weeks (around 07/27/2021) for LROB, MD/DO or APP.  Warner Mccreedy, MD, MPH OB Fellow, Faculty Practice

## 2021-07-14 LAB — CERVICOVAGINAL ANCILLARY ONLY
Bacterial Vaginitis (gardnerella): NEGATIVE
Candida Glabrata: NEGATIVE
Candida Vaginitis: NEGATIVE
Chlamydia: NEGATIVE
Comment: NEGATIVE
Comment: NEGATIVE
Comment: NEGATIVE
Comment: NEGATIVE
Comment: NEGATIVE
Comment: NORMAL
Neisseria Gonorrhea: NEGATIVE
Trichomonas: NEGATIVE

## 2021-08-02 ENCOUNTER — Ambulatory Visit (INDEPENDENT_AMBULATORY_CARE_PROVIDER_SITE_OTHER): Payer: BC Managed Care – PPO | Admitting: Obstetrics and Gynecology

## 2021-08-02 ENCOUNTER — Encounter: Payer: Self-pay | Admitting: Obstetrics and Gynecology

## 2021-08-02 ENCOUNTER — Other Ambulatory Visit: Payer: Self-pay

## 2021-08-02 VITALS — BP 112/74 | HR 91 | Wt 152.7 lb

## 2021-08-02 DIAGNOSIS — Z3480 Encounter for supervision of other normal pregnancy, unspecified trimester: Secondary | ICD-10-CM

## 2021-08-02 DIAGNOSIS — R8271 Bacteriuria: Secondary | ICD-10-CM

## 2021-08-02 MED ORDER — DOCUSATE SODIUM 100 MG PO CAPS: 100.0000 mg | ORAL_CAPSULE | Freq: Two times a day (BID) | ORAL | 2 refills | Status: DC | PRN

## 2021-08-02 NOTE — Progress Notes (Signed)
Subjective:  April Ramos is a 20 y.o. G1P0 at [redacted]w[redacted]d being seen today for ongoing prenatal care.  She is currently monitored for the following issues for this low-risk pregnancy and has GBS bacteriuria and Supervision of other normal pregnancy, antepartum on their problem list.  Patient reports general discomforts of pregnancy and constipation.  Contractions: Irritability. Vag. Bleeding: None.  Movement: Present. Denies leaking of fluid.   The following portions of the patient's history were reviewed and updated as appropriate: allergies, current medications, past family history, past medical history, past social history, past surgical history and problem list. Problem list updated.  Objective:   Vitals:   08/02/21 1111  BP: 112/74  Pulse: 91  Weight: 152 lb 11.2 oz (69.3 kg)    Fetal Status: Fetal Heart Rate (bpm): 175   Movement: Present     General:  Alert, oriented and cooperative. Patient is in no acute distress.  Skin: Skin is warm and dry. No rash noted.   Cardiovascular: Normal heart rate noted  Respiratory: Normal respiratory effort, no problems with respiration noted  Abdomen: Soft, gravid, appropriate for gestational age. Pain/Pressure: Present     Pelvic:  Cervical exam deferred        Extremities: Normal range of motion.  Edema: Trace  Mental Status: Normal mood and affect. Normal behavior. Normal judgment and thought content.   Urinalysis:      Assessment and Plan:  Pregnancy: G1P0 at [redacted]w[redacted]d  1. Supervision of other normal pregnancy, antepartum Stable Colace for constipation   2. GBS bacteriuria Tx while in labor  Preterm labor symptoms and general obstetric precautions including but not limited to vaginal bleeding, contractions, leaking of fluid and fetal movement were reviewed in detail with the patient. Please refer to After Visit Summary for other counseling recommendations.  Return for OB visit, face to face, any provider.   Hermina Staggers, MD

## 2021-08-02 NOTE — Patient Instructions (Signed)

## 2021-08-16 ENCOUNTER — Encounter: Payer: Self-pay | Admitting: Obstetrics and Gynecology

## 2021-08-16 ENCOUNTER — Other Ambulatory Visit: Payer: Self-pay

## 2021-08-16 ENCOUNTER — Ambulatory Visit (INDEPENDENT_AMBULATORY_CARE_PROVIDER_SITE_OTHER): Payer: BC Managed Care – PPO | Admitting: Obstetrics and Gynecology

## 2021-08-16 VITALS — BP 131/80 | HR 89 | Wt 158.6 lb

## 2021-08-16 DIAGNOSIS — R8271 Bacteriuria: Secondary | ICD-10-CM

## 2021-08-16 DIAGNOSIS — Z3A36 36 weeks gestation of pregnancy: Secondary | ICD-10-CM

## 2021-08-16 DIAGNOSIS — Z3480 Encounter for supervision of other normal pregnancy, unspecified trimester: Secondary | ICD-10-CM

## 2021-08-16 NOTE — Progress Notes (Signed)
   PRENATAL VISIT NOTE  Subjective:  April Ramos is a 20 y.o. G1P0 at [redacted]w[redacted]d being seen today for ongoing prenatal care.  She is currently monitored for the following issues for this low-risk pregnancy and has GBS bacteriuria and Supervision of other normal pregnancy, antepartum on their problem list.  Patient reports  right hip pain .  Contractions: Irritability. Vag. Bleeding: None.  Movement: Present. Denies leaking of fluid.   The following portions of the patient's history were reviewed and updated as appropriate: allergies, current medications, past family history, past medical history, past social history, past surgical history and problem list.   Objective:   Vitals:   08/16/21 1611  BP: 131/80  Pulse: 89  Weight: 158 lb 9.6 oz (71.9 kg)    Fetal Status: Fetal Heart Rate (bpm): 157 Fundal Height: 36 cm Movement: Present  Presentation: Vertex  General:  Alert, oriented and cooperative. Patient is in no acute distress.  Skin: Skin is warm and dry. No rash noted.   Cardiovascular: Normal heart rate noted  Respiratory: Normal respiratory effort, no problems with respiration noted  Abdomen: Soft, gravid, appropriate for gestational age.  Pain/Pressure: Present     Pelvic: Cervical exam performed in the presence of a chaperone Dilation: 2 Effacement (%): 60 Station: -1  Extremities: Normal range of motion.  Edema: Trace  Mental Status: Normal mood and affect. Normal behavior. Normal judgment and thought content.   Assessment and Plan:  Pregnancy: G1P0 at [redacted]w[redacted]d 1. GBS bacteriuria - PCN in labor  2. Supervision of other normal pregnancy, antepartum - Reassurance given for right hip pain - Cervix is starting to dilate and head is quite low - the likely cause for her discomfort. 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 She at this time wishes to continue to work, but they are very supportive of her at work. Her mom is with her today and is also very supportive.   Preterm  labor symptoms and general obstetric precautions including but not limited to vaginal bleeding, contractions, leaking of fluid and fetal movement were reviewed in detail with the patient. Please refer to After Visit Summary for other counseling recommendations.   Return in about 1 week (around 08/23/2021).  Future Appointments  Date Time Provider Department Center  08/25/2021  2:15 PM Noralee Chars [facility]    Milas Hock, MD

## 2021-08-25 ENCOUNTER — Ambulatory Visit (INDEPENDENT_AMBULATORY_CARE_PROVIDER_SITE_OTHER): Payer: BC Managed Care – PPO | Admitting: Family Medicine

## 2021-08-25 ENCOUNTER — Other Ambulatory Visit: Payer: Self-pay

## 2021-08-25 VITALS — BP 123/75 | HR 93 | Wt 161.2 lb

## 2021-08-25 DIAGNOSIS — R8271 Bacteriuria: Secondary | ICD-10-CM

## 2021-08-25 DIAGNOSIS — Z3480 Encounter for supervision of other normal pregnancy, unspecified trimester: Secondary | ICD-10-CM

## 2021-08-25 LAB — OB RESULTS CONSOLE GBS: GBS: POSITIVE

## 2021-08-25 NOTE — Progress Notes (Signed)
   PRENATAL VISIT NOTE  Subjective:  April Ramos is a 20 y.o. G1P0 at [redacted]w[redacted]d being seen today for ongoing prenatal care.  She is currently monitored for the following issues for this low-risk pregnancy and has GBS bacteriuria and Supervision of other normal pregnancy, antepartum on their problem list.  Patient reports no complaints.  Contractions: Irritability. Vag. Bleeding: None.  Movement: Present. Denies leaking of fluid.   The following portions of the patient's history were reviewed and updated as appropriate: allergies, current medications, past family history, past medical history, past social history, past surgical history and problem list.   Objective:   Vitals:   08/25/21 1414  BP: 123/75  Pulse: 93  Weight: 161 lb 3.2 oz (73.1 kg)    Fetal Status: Fetal Heart Rate (bpm): 137   Movement: Present     General:  Alert, oriented and cooperative. Patient is in no acute distress.  Skin: Skin is warm and dry. No rash noted.   Cardiovascular: Normal heart rate noted  Respiratory: Normal respiratory effort, no problems with respiration noted  Abdomen: Soft, gravid, appropriate for gestational age.  Pain/Pressure: Present     Pelvic: Cervical exam deferred        Extremities: Normal range of motion.  Edema: Trace  Mental Status: Normal mood and affect. Normal behavior. Normal judgment and thought content.   Assessment and Plan:  Pregnancy: G1P0 at [redacted]w[redacted]d 1. Supervision of other normal pregnancy, antepartum FHT and FH normal  2. GBS bacteriuria Intrapartum PPx  Term labor symptoms and general obstetric precautions including but not limited to vaginal bleeding, contractions, leaking of fluid and fetal movement were reviewed in detail with the patient. Please refer to After Visit Summary for other counseling recommendations.   No follow-ups on file.  No future appointments.  Levie Heritage, DO

## 2021-09-01 ENCOUNTER — Other Ambulatory Visit: Payer: Self-pay

## 2021-09-01 ENCOUNTER — Ambulatory Visit (INDEPENDENT_AMBULATORY_CARE_PROVIDER_SITE_OTHER): Payer: BC Managed Care – PPO | Admitting: Family Medicine

## 2021-09-01 ENCOUNTER — Encounter: Payer: Self-pay | Admitting: Family Medicine

## 2021-09-01 VITALS — BP 127/84 | HR 101 | Wt 166.4 lb

## 2021-09-01 DIAGNOSIS — R8271 Bacteriuria: Secondary | ICD-10-CM

## 2021-09-01 DIAGNOSIS — Z3480 Encounter for supervision of other normal pregnancy, unspecified trimester: Secondary | ICD-10-CM | POA: Diagnosis not present

## 2021-09-01 NOTE — Progress Notes (Signed)
   Subjective:  Oaklee Esther is a 20 y.o. G1P0 at [redacted]w[redacted]d being seen today for ongoing prenatal care.  She is currently monitored for the following issues for this low-risk pregnancy and has GBS bacteriuria and Supervision of other normal pregnancy, antepartum on their problem list.  Patient reports no complaints.  Contractions: Irritability. Vag. Bleeding: None.  Movement: (!) Decreased. Denies leaking of fluid.   The following portions of the patient's history were reviewed and updated as appropriate: allergies, current medications, past family history, past medical history, past social history, past surgical history and problem list. Problem list updated.  Objective:   Vitals:   09/01/21 1530  BP: 127/84  Pulse: (!) 101  Weight: 166 lb 6.4 oz (75.5 kg)    Fetal Status: Fetal Heart Rate (bpm): 141   Movement: (!) Decreased     General:  Alert, oriented and cooperative. Patient is in no acute distress.  Skin: Skin is warm and dry. No rash noted.   Cardiovascular: Normal heart rate noted  Respiratory: Normal respiratory effort, no problems with respiration noted  Abdomen: Soft, gravid, appropriate for gestational age. Pain/Pressure: Present     Pelvic: Vag. Bleeding: None     Cervical exam deferred        Extremities: Normal range of motion.  Edema: Trace  Mental Status: Normal mood and affect. Normal behavior. Normal judgment and thought content.   Urinalysis:      Assessment and Plan:  Pregnancy: G1P0 at [redacted]w[redacted]d  1. Supervision of other normal pregnancy, antepartum BP and FHR normal Reported DFM, this resolved while here and NST was reactive Also some SOB, no leg swelling, no chest pain, no recent travel, does not smoke, no personal/family hx of blood clots Reassured patient likely due to advancing gestational age, reviewed warning signs  2. GBS bacteriuria Answered many questions regarding this, discussed ppx in labor  Term labor symptoms and general obstetric precautions  including but not limited to vaginal bleeding, contractions, leaking of fluid and fetal movement were reviewed in detail with the patient. Please refer to After Visit Summary for other counseling recommendations.  Return in 1 week (on 09/08/2021) for [facility], ob visit.   Venora Maples, MD

## 2021-09-01 NOTE — Patient Instructions (Signed)

## 2021-09-07 ENCOUNTER — Encounter: Payer: Self-pay | Admitting: Advanced Practice Midwife

## 2021-09-07 ENCOUNTER — Ambulatory Visit (INDEPENDENT_AMBULATORY_CARE_PROVIDER_SITE_OTHER): Payer: BC Managed Care – PPO | Admitting: Advanced Practice Midwife

## 2021-09-07 ENCOUNTER — Other Ambulatory Visit: Payer: Self-pay

## 2021-09-07 ENCOUNTER — Other Ambulatory Visit: Payer: Self-pay | Admitting: Advanced Practice Midwife

## 2021-09-07 VITALS — BP 135/86 | HR 102 | Wt 170.1 lb

## 2021-09-07 DIAGNOSIS — Z3403 Encounter for supervision of normal first pregnancy, third trimester: Secondary | ICD-10-CM

## 2021-09-07 DIAGNOSIS — Z3A39 39 weeks gestation of pregnancy: Secondary | ICD-10-CM

## 2021-09-07 NOTE — Progress Notes (Signed)
Patient reports contractions starting from last week along with "light pink blood" when she wipes

## 2021-09-07 NOTE — Progress Notes (Signed)
   PRENATAL VISIT NOTE  Subjective:  April Ramos is a 20 y.o. G1P0 at [redacted]w[redacted]d being seen today for ongoing prenatal care.  She is currently monitored for the following issues for this low-risk pregnancy and has GBS bacteriuria and Supervision of other normal pregnancy, antepartum on their problem list.  Patient reports no complaints.  Contractions: Irritability. Vag. Bleeding: Scant.  Movement: Present. Denies leaking of fluid.   The following portions of the patient's history were reviewed and updated as appropriate: allergies, current medications, past family history, past medical history, past social history, past surgical history and problem list.   Objective:   Vitals:   09/07/21 0942  BP: 135/86  Pulse: (!) 102  Weight: 77.2 kg    Fetal Status: Fetal Heart Rate (bpm): 158 Fundal Height: 40 cm Movement: Present     General:  Alert, oriented and cooperative. Patient is in no acute distress.  Skin: Skin is warm and dry. No rash noted.   Cardiovascular: Normal heart rate noted  Respiratory: Normal respiratory effort, no problems with respiration noted  Abdomen: Soft, gravid, appropriate for gestational age.  Pain/Pressure: Present     Pelvic: Cervical exam performed in the presence of a chaperone Dilation: 1 Effacement (%): 60 Station: 0  Extremities: Normal range of motion.  Edema: Trace  Mental Status: Normal mood and affect. Normal behavior. Normal judgment and thought content.   Assessment and Plan:  Pregnancy: G1P0 at [redacted]w[redacted]d 1. Encounter for supervision of normal first pregnancy in third trimester -routine care  2. [redacted] weeks gestation of pregnancy - Post dates NST and BPP at next visit - IOL for 12/17, orders placed  Term labor symptoms and general obstetric precautions including but not limited to vaginal bleeding, contractions, leaking of fluid and fetal movement were reviewed in detail with the patient. Please refer to After Visit Summary for other counseling  recommendations.   Return in about 1 week (around 09/14/2021).  No future appointments.  Thressa Sheller DNP, CNM  09/07/21  10:23 AM

## 2021-09-08 ENCOUNTER — Encounter: Payer: Self-pay | Admitting: Student

## 2021-09-11 ENCOUNTER — Encounter (HOSPITAL_COMMUNITY): Payer: Self-pay | Admitting: Obstetrics and Gynecology

## 2021-09-11 ENCOUNTER — Other Ambulatory Visit: Payer: Self-pay

## 2021-09-11 ENCOUNTER — Inpatient Hospital Stay (EMERGENCY_DEPARTMENT_HOSPITAL)
Admission: AD | Admit: 2021-09-11 | Discharge: 2021-09-11 | Disposition: A | Discharge status: Home / Self Care | Payer: BC Managed Care – PPO | Source: Home / Self Care | Attending: Obstetrics and Gynecology | Admitting: Obstetrics and Gynecology

## 2021-09-11 DIAGNOSIS — O480 Post-term pregnancy: Secondary | ICD-10-CM | POA: Insufficient documentation

## 2021-09-11 DIAGNOSIS — Z3689 Encounter for other specified antenatal screening: Secondary | ICD-10-CM

## 2021-09-11 DIAGNOSIS — Z0371 Encounter for suspected problem with amniotic cavity and membrane ruled out: Secondary | ICD-10-CM | POA: Diagnosis not present

## 2021-09-11 DIAGNOSIS — Z3A40 40 weeks gestation of pregnancy: Secondary | ICD-10-CM | POA: Insufficient documentation

## 2021-09-11 DIAGNOSIS — O471 False labor at or after 37 completed weeks of gestation: Secondary | ICD-10-CM | POA: Insufficient documentation

## 2021-09-11 DIAGNOSIS — O479 False labor, unspecified: Secondary | ICD-10-CM

## 2021-09-11 NOTE — MAU Provider Note (Signed)
Event Date/Time  First Provider Initiated Contact with Patient 09/11/21 1428     S: Ms. April Ramos is a 20 y.o. G1P0 at [redacted]w[redacted]d  who presents to MAU today complaining of leaking of fluid since last night. She denies vaginal bleeding. She endorses contractions beginning two nights ago. She reports normal fetal movement.    O: BP 132/85   Pulse 88   Temp 98.3 F (36.8 C) (Oral)   Resp 18   Ht 5\' 1"  (1.549 m)   Wt 76.4 kg   LMP 11/05/2020   SpO2 98%   BMI 31.82 kg/m  GENERAL: Well-developed, well-nourished female in no acute distress.  HEAD: Normocephalic, atraumatic.  CHEST: Normal effort of breathing, regular heart rate ABDOMEN: Soft, nontender, gravid PELVIC: Normal external female genitalia. Vagina is pink and rugated. Cervix with normal contour, no lesions. Normal discharge.  Negative pooling.   Cervical exam:  Dilation: 1 Exam by:: sam Netanel Yannuzzi cnm   Fetal Monitoring: Baseline: 130 Variability: Mod Accelerations: 15 x 15 Decelerations: None Contractions: Occasional, predominantly UI   A: SIUP at [redacted]w[redacted]d  Membranes intact (S/p SSE, negative pooling, negative fern) Cat I tracing Cervix remains 1 cm Normotensive  P: Discharge home in stable condition  [redacted]w[redacted]d, MSA, MSN, CNM Certified Nurse Midwife, [employer] for [employer], clinical, [facility]

## 2021-09-11 NOTE — MAU Note (Signed)
Having contractions and bad back pain.  Started 2 nights ago, but getting a lot worse, now every 5 min.? Leaking started last night, has an adult diaper on, clear fluid- keeps coming. Trace of blood. Was 2-3 a wk ago.

## 2021-09-11 NOTE — Discharge Instructions (Signed)

## 2021-09-12 ENCOUNTER — Encounter (HOSPITAL_COMMUNITY): Payer: Self-pay | Admitting: Obstetrics & Gynecology

## 2021-09-12 ENCOUNTER — Inpatient Hospital Stay (HOSPITAL_COMMUNITY)
Admission: AD | Admit: 2021-09-12 | Discharge: 2021-09-15 | DRG: 807 | Disposition: A | Discharge status: Home / Self Care | Payer: BC Managed Care – PPO | Attending: Family Medicine | Admitting: Family Medicine

## 2021-09-12 ENCOUNTER — Inpatient Hospital Stay (HOSPITAL_COMMUNITY): Payer: BC Managed Care – PPO | Admitting: Anesthesiology

## 2021-09-12 ENCOUNTER — Other Ambulatory Visit: Payer: Self-pay

## 2021-09-12 DIAGNOSIS — Z20822 Contact with and (suspected) exposure to covid-19: Secondary | ICD-10-CM | POA: Diagnosis present

## 2021-09-12 DIAGNOSIS — O4292 Full-term premature rupture of membranes, unspecified as to length of time between rupture and onset of labor: Secondary | ICD-10-CM | POA: Diagnosis present

## 2021-09-12 DIAGNOSIS — Z3480 Encounter for supervision of other normal pregnancy, unspecified trimester: Secondary | ICD-10-CM

## 2021-09-12 DIAGNOSIS — O135 Gestational [pregnancy-induced] hypertension without significant proteinuria, complicating the puerperium: Secondary | ICD-10-CM | POA: Diagnosis not present

## 2021-09-12 DIAGNOSIS — O99824 Streptococcus B carrier state complicating childbirth: Secondary | ICD-10-CM | POA: Diagnosis present

## 2021-09-12 DIAGNOSIS — O26893 Other specified pregnancy related conditions, third trimester: Secondary | ICD-10-CM | POA: Diagnosis present

## 2021-09-12 DIAGNOSIS — Z3A40 40 weeks gestation of pregnancy: Secondary | ICD-10-CM | POA: Diagnosis not present

## 2021-09-12 DIAGNOSIS — O480 Post-term pregnancy: Secondary | ICD-10-CM | POA: Diagnosis not present

## 2021-09-12 DIAGNOSIS — O139 Gestational [pregnancy-induced] hypertension without significant proteinuria, unspecified trimester: Secondary | ICD-10-CM | POA: Diagnosis not present

## 2021-09-12 DIAGNOSIS — O99820 Streptococcus B carrier state complicating pregnancy: Secondary | ICD-10-CM | POA: Diagnosis not present

## 2021-09-12 DIAGNOSIS — O4202 Full-term premature rupture of membranes, onset of labor within 24 hours of rupture: Secondary | ICD-10-CM | POA: Diagnosis not present

## 2021-09-12 DIAGNOSIS — O134 Gestational [pregnancy-induced] hypertension without significant proteinuria, complicating childbirth: Secondary | ICD-10-CM | POA: Diagnosis present

## 2021-09-12 DIAGNOSIS — R8271 Bacteriuria: Secondary | ICD-10-CM

## 2021-09-12 LAB — COMPREHENSIVE METABOLIC PANEL
ALT: 19 U/L (ref 0–44)
AST: 20 U/L (ref 15–41)
Albumin: 3 g/dL — ABNORMAL LOW (ref 3.5–5.0)
Alkaline Phosphatase: 132 U/L — ABNORMAL HIGH (ref 38–126)
Anion gap: 9 (ref 5–15)
BUN: 11 mg/dL (ref 6–20)
CO2: 20 mmol/L — ABNORMAL LOW (ref 22–32)
Calcium: 9.8 mg/dL (ref 8.9–10.3)
Chloride: 107 mmol/L (ref 98–111)
Creatinine, Ser: 0.7 mg/dL (ref 0.44–1.00)
GFR, Estimated: >60 mL/min (ref >60)
Glucose, Bld: 90 mg/dL (ref 70–99)
Potassium: 4.2 mmol/L (ref 3.5–5.1)
Sodium: 136 mmol/L (ref 135–145)
Total Bilirubin: 0.3 mg/dL (ref 0.3–1.2)
Total Protein: 6.5 g/dL (ref 6.5–8.1)

## 2021-09-12 LAB — CBC
HCT: 36.9 % (ref 36.0–46.0)
Hemoglobin: 12 g/dL (ref 12.0–15.0)
MCH: 30.4 pg (ref 26.0–34.0)
MCHC: 32.5 g/dL (ref 30.0–36.0)
MCV: 93.4 fL (ref 80.0–100.0)
Platelets: 168 10*3/uL (ref 150–400)
RBC: 3.95 MIL/uL (ref 3.87–5.11)
RDW: 14.3 % (ref 11.5–15.5)
WBC: 10.5 10*3/uL (ref 4.0–10.5)
nRBC: 0 % (ref 0.0–0.2)

## 2021-09-12 LAB — RESP PANEL BY RT-PCR (FLU A&B, COVID) ARPGX2
Influenza A by PCR: NEGATIVE
Influenza B by PCR: NEGATIVE
SARS Coronavirus 2 by RT PCR: NEGATIVE

## 2021-09-12 LAB — POCT FERN TEST: POCT Fern Test: POSITIVE

## 2021-09-12 LAB — PROTEIN / CREATININE RATIO, URINE
Creatinine, Urine: 66.86 mg/dL
Protein Creatinine Ratio: 0.19 mg/mg{Cre} — ABNORMAL HIGH (ref 0.00–0.15)
Total Protein, Urine: 13 mg/dL

## 2021-09-12 LAB — TYPE AND SCREEN
ABO/RH(D): A POS
Antibody Screen: NEGATIVE

## 2021-09-12 LAB — RPR: RPR Ser Ql: NONREACTIVE

## 2021-09-12 MED ORDER — ACETAMINOPHEN 325 MG PO TABS: 650.0000 mg | ORAL_TABLET | ORAL | Status: DC | PRN

## 2021-09-12 MED ORDER — OXYCODONE-ACETAMINOPHEN 5-325 MG PO TABS: 1.0000 | ORAL_TABLET | ORAL | Status: DC | PRN

## 2021-09-12 MED ORDER — ONDANSETRON HCL 4 MG/2ML IJ SOLN
4.0000 mg | Freq: Four times a day (QID) | INTRAMUSCULAR | Status: DC | PRN
  Administered 2021-09-12 – 2021-09-13 (×2): 4 mg via INTRAVENOUS
  Filled 2021-09-12 (×2): qty 2

## 2021-09-12 MED ORDER — PHENYLEPHRINE 40 MCG/ML (10ML) SYRINGE FOR IV PUSH (FOR BLOOD PRESSURE SUPPORT)
80.0000 ug | PREFILLED_SYRINGE | INTRAVENOUS | Status: DC | PRN
  Filled 2021-09-12: qty 10

## 2021-09-12 MED ORDER — OXYTOCIN-SODIUM CHLORIDE 30-0.9 UT/500ML-% IV SOLN
2.5000 [IU]/h | INTRAVENOUS | Status: DC
  Administered 2021-09-13: 2.5 [IU]/h via INTRAVENOUS
  Filled 2021-09-12: qty 500

## 2021-09-12 MED ORDER — EPHEDRINE 5 MG/ML INJ: 10.0000 mg | INTRAVENOUS | Status: DC | PRN

## 2021-09-12 MED ORDER — SODIUM CHLORIDE 0.9 % IV SOLN
5.0000 10*6.[IU] | Freq: Once | INTRAVENOUS | Status: AC
  Administered 2021-09-12: 5 10*6.[IU] via INTRAVENOUS
  Filled 2021-09-12: qty 5

## 2021-09-12 MED ORDER — TERBUTALINE SULFATE 1 MG/ML IJ SOLN
INTRAMUSCULAR | Status: AC
  Filled 2021-09-12: qty 1

## 2021-09-12 MED ORDER — LIDOCAINE HCL (PF) 1 % IJ SOLN
INTRAMUSCULAR | Status: DC | PRN
  Administered 2021-09-12: 8 mL via EPIDURAL

## 2021-09-12 MED ORDER — LACTATED RINGERS IV SOLN: INTRAVENOUS | Status: DC

## 2021-09-12 MED ORDER — OXYTOCIN BOLUS FROM INFUSION
333.0000 mL | Freq: Once | INTRAVENOUS | Status: AC
  Administered 2021-09-13: 333 mL via INTRAVENOUS

## 2021-09-12 MED ORDER — SOD CITRATE-CITRIC ACID 500-334 MG/5ML PO SOLN: 30.0000 mL | ORAL | Status: DC | PRN

## 2021-09-12 MED ORDER — LABETALOL HCL 5 MG/ML IV SOLN: 20.0000 mg | INTRAVENOUS | Status: DC | PRN

## 2021-09-12 MED ORDER — HYDRALAZINE HCL 20 MG/ML IJ SOLN: 10.0000 mg | INTRAMUSCULAR | Status: DC | PRN

## 2021-09-12 MED ORDER — TERBUTALINE SULFATE 1 MG/ML IJ SOLN
0.2500 mg | Freq: Once | INTRAMUSCULAR | Status: AC
  Administered 2021-09-12: 0.25 mg via SUBCUTANEOUS

## 2021-09-12 MED ORDER — LACTATED RINGERS AMNIOINFUSION: INTRAVENOUS | Status: DC

## 2021-09-12 MED ORDER — PHENYLEPHRINE 40 MCG/ML (10ML) SYRINGE FOR IV PUSH (FOR BLOOD PRESSURE SUPPORT): 80.0000 ug | PREFILLED_SYRINGE | INTRAVENOUS | Status: DC | PRN

## 2021-09-12 MED ORDER — LABETALOL HCL 5 MG/ML IV SOLN: 40.0000 mg | INTRAVENOUS | Status: DC | PRN

## 2021-09-12 MED ORDER — OXYTOCIN-SODIUM CHLORIDE 30-0.9 UT/500ML-% IV SOLN
1.0000 m[IU]/min | INTRAVENOUS | Status: DC
Start: 2021-09-12 — End: 2021-09-13
  Administered 2021-09-12: 2 m[IU]/min via INTRAVENOUS

## 2021-09-12 MED ORDER — LACTATED RINGERS IV SOLN
500.0000 mL | INTRAVENOUS | Status: DC | PRN
  Administered 2021-09-12: 500 mL via INTRAVENOUS

## 2021-09-12 MED ORDER — FENTANYL-BUPIVACAINE-NACL 0.5-0.125-0.9 MG/250ML-% EP SOLN
12.0000 mL/h | EPIDURAL | Status: DC | PRN
  Administered 2021-09-12 – 2021-09-13 (×3): 12 mL/h via EPIDURAL
  Filled 2021-09-12 (×3): qty 250

## 2021-09-12 MED ORDER — LIDOCAINE HCL (PF) 1 % IJ SOLN: 30.0000 mL | INTRAMUSCULAR | Status: DC | PRN

## 2021-09-12 MED ORDER — TERBUTALINE SULFATE 1 MG/ML IJ SOLN
0.2500 mg | Freq: Once | INTRAMUSCULAR | Status: DC | PRN
  Filled 2021-09-12: qty 1

## 2021-09-12 MED ORDER — FENTANYL CITRATE (PF) 100 MCG/2ML IJ SOLN
50.0000 ug | INTRAMUSCULAR | Status: DC | PRN
  Administered 2021-09-12: 100 ug via INTRAVENOUS
  Filled 2021-09-12: qty 2

## 2021-09-12 MED ORDER — DIPHENHYDRAMINE HCL 50 MG/ML IJ SOLN: 12.5000 mg | INTRAMUSCULAR | Status: DC | PRN

## 2021-09-12 MED ORDER — LACTATED RINGERS IV SOLN: 500.0000 mL | Freq: Once | INTRAVENOUS | Status: DC

## 2021-09-12 MED ORDER — OXYCODONE-ACETAMINOPHEN 5-325 MG PO TABS: 2.0000 | ORAL_TABLET | ORAL | Status: DC | PRN

## 2021-09-12 MED ORDER — FLEET ENEMA 7-19 GM/118ML RE ENEM: 1.0000 | ENEMA | RECTAL | Status: DC | PRN

## 2021-09-12 MED ORDER — LABETALOL HCL 5 MG/ML IV SOLN: 80.0000 mg | INTRAVENOUS | Status: DC | PRN

## 2021-09-12 MED ORDER — PENICILLIN G POT IN DEXTROSE 60000 UNIT/ML IV SOLN
3.0000 10*6.[IU] | INTRAVENOUS | Status: DC
  Administered 2021-09-12 – 2021-09-13 (×7): 3 10*6.[IU] via INTRAVENOUS
  Filled 2021-09-12 (×7): qty 50

## 2021-09-12 NOTE — Progress Notes (Signed)
Labor Progress Note April Ramos is a 20 y.o. G1P0 at [redacted]w[redacted]d presented for IOL d/t SROM.   S: Doing well, feeling pressure/discomfort intermittently   O:  BP 131/83   Pulse 97   Temp 98 F (36.7 C) (Oral)   Resp 18   Ht 5\' 1"  (1.549 m)   Wt 78.2 kg   LMP 11/05/2020   SpO2 99%   BMI 32.56 kg/m  EFM: 125/mod/15x15/none  CVE: Dilation: 6.5 Effacement (%): 90 Cervical Position: Anterior Station: -2 Presentation: Vertex Exam by:: Dr. [email or web address]   A&P: 20 y.o. G1P0 [redacted]w[redacted]d  #Labor: Some progression since last check, MVU's have not been adequate with an irregular coupled pattern. Placed in to right lateral position with a peanut (did not tolerate left unfortunately, has not for the majority of the day) from high fowlers for the past hour. Start pit at 2, may not need much.  #Pain: Epidural  #FWB: Cat I currently, intermittently between Cat II but reassuring  #GBS positive on PCN   #Gestational hypertension: Mild range only. No symptoms. Pre-e labs wnl. cont to monitor.    [redacted]w[redacted]d, DO 4:48 PM

## 2021-09-12 NOTE — Plan of Care (Signed)

## 2021-09-12 NOTE — H&P (Signed)
OBSTETRIC ADMISSION HISTORY AND PHYSICAL  April Ramos is a 20 y.o. female G1P0 with IUP at [redacted]w[redacted]d by 7 week [place] presenting for SROM. Patient states her water broke around 10 PM. She has had painful contractions since then, mainly in her back. She reports +FMs, no VB, no blurry vision, headaches, peripheral edema, or RUQ pain.  She plans on breast feeding. She requests OCPs for birth control postpartum.  She received her prenatal care at [facility].   Dating: By [place] --->  Estimated Date of Delivery: 09/11/21  Sono:   @[redacted]w[redacted]d , normal anatomy, cephalic presentation, anterior placental lie, 839 g, 50% EFW  Prenatal History/Complications:  GBS bacteruria  Past Medical History: Past Medical History:  Diagnosis Date   Medical history non-contributory     Past Surgical History: Past Surgical History:  Procedure Laterality Date   NO PAST SURGERIES      Obstetrical History: OB History     Gravida  1   Para      Term      Preterm      AB      Living         SAB      IAB      Ectopic      Multiple      Live Births              Social History Social History   Socioeconomic History   Marital status: Single    Spouse name: Not on file   Number of children: Not on file   Years of education: Not on file   Highest education level: Not on file  Occupational History   Not on file  Tobacco Use   Smoking status: Never   Smokeless tobacco: Never  Vaping Use   Vaping Use: Former  Substance and Sexual Activity   Alcohol use: Not Currently    Comment: Social   Drug use: Not Currently    Types: Marijuana   Sexual activity: Not Currently    Birth control/protection: None  Other Topics Concern   Not on file  Social History Narrative   Not on file   Social Determinants of Health   Financial Resource Strain: Not on file  Food Insecurity: No Food Insecurity   Worried About Running Out of Food in the Last Year: Never true   Ran Out of Food in the Last Year: Never  true  Transportation Needs: No Transportation Needs   Lack of Transportation (Medical): No   Lack of Transportation (Non-Medical): No  Physical Activity: Not on file  Stress: Not on file  Social Connections: Not on file    Family History: Family History  Problem Relation Age of Onset   Fibroids Mother    Anemia Mother    Diabetes Father     Allergies: No Known Allergies  Medications Prior to Admission  Medication Sig Dispense Refill Last Dose   Prenatal Vit-Fe Fumarate-FA (MULTIVITAMIN-PRENATAL) 27-0.8 MG TABS tablet Take 1 tablet by mouth daily at 12 noon.   09/11/2021   docusate sodium (COLACE) 100 MG capsule Take 1 capsule (100 mg total) by mouth 2 (two) times daily as needed. (Patient not taking: Reported on 09/07/2021) 30 capsule 2      Review of Systems  All systems reviewed and negative except as stated in HPI  Blood pressure 120/78, pulse 86, temperature 98.9 F (37.2 C), temperature source Oral, resp. rate 16, height 5\' 1"  (1.549 m), weight 78.2 kg, last menstrual period 11/05/2020,  SpO2 99 %.  General appearance: alert, cooperative, and no distress Lungs: normal work of breathing on room air  Heart: normal rate, warm and well perfused  Abdomen: soft, non-tender, gravid  Extremities: no LE edema or calf tenderness to palpation   Presentation:  Cephalic per RN Fetal monitoring: Baseline 135 bpm, moderate variability, + accels, no decels  Uterine activity: Contractions every 1-5 minutes Dilation: 3 Effacement (%): 90 Station: 0 Exam by:: Ginnie Smart RN   Prenatal labs: ABO, Rh: --/--/A POS (12/11 0151) Antibody: NEG (12/11 0151) Rubella: 1.83 (07/14 1514) RPR: Non Reactive (09/09 1021)  HBsAg: Negative (07/14 1514)  HIV: Non Reactive (09/09 1021)  GBS:  Positive  2 hr Glucola normal  Genetic screening - LR NIPS, Horizon neg, AFP neg Anatomy US normal   Prenatal Transfer Tool  Maternal Diabetes: No Genetic Screening: Normal Maternal  Ultrasounds/Referrals: Normal Fetal Ultrasounds or other Referrals:  None Maternal Substance Abuse:  No Significant Maternal Medications:  None Significant Maternal Lab Results: Group B Strep positive  Results for orders placed or performed during the hospital encounter of 09/12/21 (from the past 24 hour(s))  POCT fern test   Collection Time: 09/12/21  1:21 AM  Result Value Ref Range   POCT Fern Test Positive = ruptured amniotic membanes   Comprehensive metabolic panel   Collection Time: 09/12/21  1:51 AM  Result Value Ref Range   Sodium 136 135 - 145 mmol/L   Potassium 4.2 3.5 - 5.1 mmol/L   Chloride 107 98 - 111 mmol/L   CO2 20 (L) 22 - 32 mmol/L   Glucose, Bld 90 70 - 99 mg/dL   BUN 11 6 - 20 mg/dL   Creatinine, Ser 1.96 0.44 - 1.00 mg/dL   Calcium 9.8 8.9 - 22.2 mg/dL   Total Protein 6.5 6.5 - 8.1 g/dL   Albumin 3.0 (L) 3.5 - 5.0 g/dL   AST 20 15 - 41 U/L   ALT 19 0 - 44 U/L   Alkaline Phosphatase 132 (H) 38 - 126 U/L   Total Bilirubin 0.3 0.3 - 1.2 mg/dL   GFR, Estimated >97 >98 mL/min   Anion gap 9 5 - 15  CBC   Collection Time: 09/12/21  1:51 AM  Result Value Ref Range   WBC 10.5 4.0 - 10.5 K/uL   RBC 3.95 3.87 - 5.11 MIL/uL   Hemoglobin 12.0 12.0 - 15.0 g/dL   HCT 92.1 19.4 - 17.4 %   MCV 93.4 80.0 - 100.0 fL   MCH 30.4 26.0 - 34.0 pg   MCHC 32.5 30.0 - 36.0 g/dL   RDW 08.1 44.8 - 18.5 %   Platelets 168 150 - 400 K/uL   nRBC 0.0 0.0 - 0.2 %  Type and screen [facility]   Collection Time: 09/12/21  1:51 AM  Result Value Ref Range   ABO/RH(D) A POS    Antibody Screen NEG    Sample Expiration      09/15/2021,2359 Performed at [facility], [address], [place], [place] [address]   Resp Panel by RT-PCR (Flu A&B, Covid) Nasopharyngeal Swab   Collection Time: 09/12/21  2:18 AM   Specimen: Nasopharyngeal Swab; Nasopharyngeal(NP) swabs in vial transport medium  Result Value Ref Range   SARS Coronavirus 2 by RT PCR NEGATIVE NEGATIVE    Influenza A by PCR NEGATIVE NEGATIVE   Influenza B by PCR NEGATIVE NEGATIVE    Patient Active Problem List   Diagnosis Date Noted  Normal labor 09/12/2021   Supervision of other normal pregnancy, antepartum 04/01/2021   GBS bacteriuria 01/28/2021    Assessment/Plan:  April Ramos is a 20 y.o. G1P0 at [redacted]w[redacted]d here for SROM.   #Labor: Favorable SVE. Having regular painful contractions. Will continue expectant management and reassess in 3-4 hours. Plan to augment if no change or contractions space.  #Pain: PRN; planning for epidural  #FWB: Cat 1  #ID:  GBS pos > PCN #MOF: Breast #MOC: OCPs  #Elevated BP: Mild range blood pressures upon admission. No other symptoms. Normal LFTs and platelets. UPC pending. Will follow up results and continue to monitor.  Worthy Rancher, MD  09/12/2021, 3:28 AM

## 2021-09-12 NOTE — MAU Note (Signed)
Pt up to bathroom - urinated without cup given cup and only amniotic fluid was noted in cup-reported to LD RN

## 2021-09-12 NOTE — MAU Note (Signed)
...  April Ramos is a 20 y.o. at [redacted]w[redacted]d here in MAU reporting: Gush of fluids around 2200 last night. She states she is wearing a pad and the fluid keeps leaking out. She states her CTX are every ten minutes and she is rating them an 8/10 and is feeling them in her lower abdomen. Endorses fetal movement but states it has decreased since her water broke. No VB.   FHT: 138 initial external Lab orders placed from triage:  MAU Labor Eval

## 2021-09-12 NOTE — Anesthesia Preprocedure Evaluation (Signed)
Anesthesia Evaluation  Patient identified by MRN, date of birth, ID band Patient awake    Reviewed: Allergy & Precautions, H&P , Patient's Chart, lab work & pertinent test results  Airway Mallampati: II  TM Distance: >3 FB Neck ROM: Full    Dental no notable dental hx.    Pulmonary neg pulmonary ROS,    Pulmonary exam normal breath sounds clear to auscultation       Cardiovascular Exercise Tolerance: Good negative cardio ROS Normal cardiovascular exam Rhythm:Regular Rate:Normal     Neuro/Psych negative neurological ROS  negative psych ROS   GI/Hepatic negative GI ROS, Neg liver ROS,   Endo/Other  negative endocrine ROS  Renal/GU negative Renal ROS  negative genitourinary   Musculoskeletal negative musculoskeletal ROS (+)   Abdominal   Peds negative pediatric ROS (+)  Hematology negative hematology ROS (+)   Anesthesia Other Findings   Reproductive/Obstetrics (+) Pregnancy                             Anesthesia Physical Anesthesia Plan  ASA: 2  Anesthesia Plan: Epidural   Post-op Pain Management:    Induction:   PONV Risk Score and Plan: 2 and Treatment may vary due to age or medical condition  Airway Management Planned: Natural Airway  Additional Equipment: None  Intra-op Plan:   Post-operative Plan:   Informed Consent: I have reviewed the patients History and Physical, chart, labs and discussed the procedure including the risks, benefits and alternatives for the proposed anesthesia with the patient or authorized representative who has indicated his/her understanding and acceptance.       Plan Discussed with: Anesthesiologist  Anesthesia Plan Comments:         Anesthesia Quick Evaluation

## 2021-09-12 NOTE — Progress Notes (Signed)
Labor Progress Note April Ramos is a 20 y.o. G1P0 at [redacted]w[redacted]d who presented for SROM.  S: Feeling more back pain/pressure. Repositioning. No other concerns at this time.  O:  BP 133/83   Pulse (!) 115   Temp 99.2 F (37.3 C) (Oral)   Resp 18   Ht 5\' 1"  (1.549 m)   Wt 78.2 kg   LMP 11/05/2020   SpO2 99%   BMI 32.56 kg/m   EFM: Baseline 140, moderate variability, + accels, late/prolonged decels   CVE: Dilation: 8 Effacement (%): 90 Cervical Position: Anterior Station: -1 Presentation: Vertex Exam by:: Dr. [email or web address]   A&P: 20 y.o. G1P0 [redacted]w[redacted]d   #Labor: Progressing since last check. Shortly after cervical exam, prolonged decel noted. Patient repositioned and Pitocin discontinued (at 2 milli-units/min). FHT then improved. Will continue to hold Pitocin for now given ongoing cervical change. Will continue to monitor closely and reassess in 2-3 hours or when feeling pressure/urge to push.  #Pain: Epidural  #FWB: Cat 2 due to prolonged decelerations. Continues to have reassuring moderate variability. Amnioinfusion ongoing. Will continue to monitor.  #GBS positive > PCN  [redacted]w[redacted]d, MD 9:07 PM

## 2021-09-12 NOTE — Progress Notes (Signed)
Labor Progress Note  Called by RN due to prolonged deceleration that started right after mom was vomiting. Prior to onset, baseline 130/mod var/+ 15x15 with ctx every 2 minutes. Instructed to give terb and evaluated at bedside. On my arrival, HR was back around 125 bpm with moderate variability, maintained variability during decel about 6 minutes total.   Checked cervix, largely unchanged with good amount of caput. After maternal consent, placed FSE. Will additionally start amnioinfusion with minimal risk but possible benefit. FHT now: Baseline 135/mod var/15x15/no decels. Placed on her right side with peanut.   Will monitor closely. Dr. Alysia Penna made aware.   Allayne Stack, DO

## 2021-09-12 NOTE — Progress Notes (Signed)
Labor Progress Note   Attempted position change in [facility] for improved fetal positioning, in the setting of this had a prolonged deceleration. Stopped pit (which started about 10 minutes prior) and placed into right lateral with no improvement. Switched back into high fowlers with resolution. Dr. Alysia Penna made aware, presented to beside and gave additional terb x1 for frequent ctx.   FHT currently reassuring: Baseline 130/mod var.   Cont to monitor, plan to restart pit in the next 1-2 hours if spaced (suspect likely after terb). Hopeful for improved fetal positioning to allow continued cervical change.   April Stack, DO

## 2021-09-12 NOTE — Anesthesia Procedure Notes (Signed)
Epidural Patient location during procedure: OB Start time: 09/12/2021 4:10 AM End time: 09/12/2021 4:20 AM  Staffing Anesthesiologist: Mellody Dance, MD Performed: anesthesiologist   Preanesthetic Checklist Completed: patient identified, IV checked, site marked, risks and benefits discussed, monitors and equipment checked, pre-op evaluation and timeout performed  Epidural Patient position: sitting Prep: DuraPrep Patient monitoring: heart rate, cardiac monitor, continuous pulse ox and blood pressure Approach: midline Location: L3-L4 Injection technique: LOR saline  Needle:  Needle type: Tuohy  Needle gauge: 17 G Needle length: 9 cm Needle insertion depth: 6.5 cm Catheter type: closed end flexible Catheter size: 20 Guage Catheter at skin depth: 11.5 cm Test dose: negative and Other  Assessment Events: blood not aspirated, injection not painful, no injection resistance and negative IV test  Additional Notes Informed consent obtained prior to proceeding including risk of failure, 1% risk of PDPH, risk of minor discomfort and bruising.  Discussed rare but serious complications including epidural abscess, permanent nerve injury, epidural hematoma.  Discussed alternatives to epidural analgesia and patient desires to proceed.  Timeout performed pre-procedure verifying patient name, procedure, and platelet count.  Patient tolerated procedure well.

## 2021-09-12 NOTE — Progress Notes (Signed)
Labor Progress Note   Due for cervical check and called by RN due to another prolonged deceleration with position change. She already started IV fluid bolus. Currently doing expectant management, no augmentation. Evaluated at bedside, FHT now back to 115-120 with moderate variability, previous baseline 130's. Checked cervix, about 5.5/90/-2, with fetal position feeling more OT/positioned slightly abnormal onto the cervix. + Scalp stimulation. After verbal consent placed IUPC.   Initially having contractions about every 1 minute but then spaced to about every 2-3. FHT baseline now back to around 125-130 bpm. Still reassuring with moderate variability, slight early decels but otherwise no others. Currently in high fowlers. Will monitor, if further FHT changes with frequent contractions, will give terb.   Allayne Stack, DO

## 2021-09-13 ENCOUNTER — Encounter (HOSPITAL_COMMUNITY): Payer: Self-pay | Admitting: Obstetrics & Gynecology

## 2021-09-13 DIAGNOSIS — Z3A40 40 weeks gestation of pregnancy: Secondary | ICD-10-CM

## 2021-09-13 DIAGNOSIS — O135 Gestational [pregnancy-induced] hypertension without significant proteinuria, complicating the puerperium: Secondary | ICD-10-CM

## 2021-09-13 DIAGNOSIS — O480 Post-term pregnancy: Secondary | ICD-10-CM

## 2021-09-13 DIAGNOSIS — O4202 Full-term premature rupture of membranes, onset of labor within 24 hours of rupture: Secondary | ICD-10-CM

## 2021-09-13 DIAGNOSIS — O99820 Streptococcus B carrier state complicating pregnancy: Secondary | ICD-10-CM

## 2021-09-13 DIAGNOSIS — O139 Gestational [pregnancy-induced] hypertension without significant proteinuria, unspecified trimester: Secondary | ICD-10-CM | POA: Diagnosis not present

## 2021-09-13 MED ORDER — DIPHENHYDRAMINE HCL 25 MG PO CAPS: 25.0000 mg | ORAL_CAPSULE | Freq: Four times a day (QID) | ORAL | Status: DC | PRN

## 2021-09-13 MED ORDER — METHYLERGONOVINE MALEATE 0.2 MG/ML IJ SOLN: 0.2000 mg | INTRAMUSCULAR | Status: DC | PRN

## 2021-09-13 MED ORDER — DOCUSATE SODIUM 100 MG PO CAPS
100.0000 mg | ORAL_CAPSULE | Freq: Two times a day (BID) | ORAL | Status: DC
  Administered 2021-09-13 – 2021-09-14 (×3): 100 mg via ORAL
  Filled 2021-09-13 (×3): qty 1

## 2021-09-13 MED ORDER — MEDROXYPROGESTERONE ACETATE 150 MG/ML IM SUSP: 150.0000 mg | INTRAMUSCULAR | Status: DC | PRN

## 2021-09-13 MED ORDER — WITCH HAZEL-GLYCERIN EX PADS: 1.0000 "application " | MEDICATED_PAD | CUTANEOUS | Status: DC | PRN

## 2021-09-13 MED ORDER — BISACODYL 10 MG RE SUPP: 10.0000 mg | Freq: Every day | RECTAL | Status: DC | PRN

## 2021-09-13 MED ORDER — FUROSEMIDE 20 MG PO TABS: 20.0000 mg | ORAL_TABLET | Freq: Every day | ORAL | Status: DC

## 2021-09-13 MED ORDER — DIBUCAINE (PERIANAL) 1 % EX OINT: 1.0000 "application " | TOPICAL_OINTMENT | CUTANEOUS | Status: DC | PRN

## 2021-09-13 MED ORDER — ONDANSETRON HCL 4 MG/2ML IJ SOLN: 4.0000 mg | INTRAMUSCULAR | Status: DC | PRN

## 2021-09-13 MED ORDER — FUROSEMIDE 20 MG PO TABS
20.0000 mg | ORAL_TABLET | Freq: Every day | ORAL | Status: DC
  Administered 2021-09-14 – 2021-09-15 (×2): 20 mg via ORAL
  Filled 2021-09-13 (×2): qty 1

## 2021-09-13 MED ORDER — METHYLERGONOVINE MALEATE 0.2 MG PO TABS: 0.2000 mg | ORAL_TABLET | ORAL | Status: DC | PRN

## 2021-09-13 MED ORDER — MEASLES, MUMPS & RUBELLA VAC IJ SOLR: 0.5000 mL | Freq: Once | INTRAMUSCULAR | Status: DC

## 2021-09-13 MED ORDER — TETANUS-DIPHTH-ACELL PERTUSSIS 5-2.5-18.5 LF-MCG/0.5 IM SUSY: 0.5000 mL | PREFILLED_SYRINGE | Freq: Once | INTRAMUSCULAR | Status: DC

## 2021-09-13 MED ORDER — IBUPROFEN 600 MG PO TABS
600.0000 mg | ORAL_TABLET | Freq: Four times a day (QID) | ORAL | Status: DC
  Administered 2021-09-13 – 2021-09-15 (×8): 600 mg via ORAL
  Filled 2021-09-13 (×8): qty 1

## 2021-09-13 MED ORDER — ONDANSETRON HCL 4 MG PO TABS: 4.0000 mg | ORAL_TABLET | ORAL | Status: DC | PRN

## 2021-09-13 MED ORDER — FLEET ENEMA 7-19 GM/118ML RE ENEM: 1.0000 | ENEMA | Freq: Every day | RECTAL | Status: DC | PRN

## 2021-09-13 MED ORDER — BENZOCAINE-MENTHOL 20-0.5 % EX AERO
1.0000 "application " | INHALATION_SPRAY | CUTANEOUS | Status: DC | PRN
  Filled 2021-09-13 (×2): qty 56

## 2021-09-13 MED ORDER — COCONUT OIL OIL: 1.0000 "application " | TOPICAL_OIL | Status: DC | PRN

## 2021-09-13 MED ORDER — SIMETHICONE 80 MG PO CHEW: 80.0000 mg | CHEWABLE_TABLET | ORAL | Status: DC | PRN

## 2021-09-13 MED ORDER — FERROUS SULFATE 325 (65 FE) MG PO TABS
325.0000 mg | ORAL_TABLET | ORAL | Status: DC
  Administered 2021-09-13: 325 mg via ORAL
  Filled 2021-09-13: qty 1

## 2021-09-13 MED ORDER — ACETAMINOPHEN 325 MG PO TABS
650.0000 mg | ORAL_TABLET | ORAL | Status: DC | PRN
  Administered 2021-09-13: 650 mg via ORAL
  Filled 2021-09-13: qty 2

## 2021-09-13 MED ORDER — PRENATAL MULTIVITAMIN CH
1.0000 | ORAL_TABLET | Freq: Every day | ORAL | Status: DC
  Administered 2021-09-13 – 2021-09-14 (×2): 1 via ORAL
  Filled 2021-09-13 (×2): qty 1

## 2021-09-13 NOTE — Progress Notes (Signed)
No change in fetal station with pushing. Will labor down and reassess in 1 hour.   Evalina Field, MD

## 2021-09-13 NOTE — Discharge Summary (Signed)
Postpartum Discharge Summary  Date of Service updated 09/15/21     Patient Name: April Ramos DOB: [birth year] MRN: [id number]  Date of admission: 09/12/2021 Delivery date:09/13/2021  Delivering provider: Christin Fudge  Date of discharge: 09/15/2021  Admitting diagnosis: Normal labor [O80, Z37.9] Intrauterine pregnancy: [redacted]w[redacted]d    Secondary diagnosis:  Principal Problem:   Normal labor Active Problems:   Gestational hypertension  Additional problems: intrapartum GHTN    Discharge diagnosis: Term Pregnancy Delivered                                              Post partum procedures: none Augmentation: Pitocin and Cytotec Complications: RORV>61hours  Hospital course: Induction of Labor With Vaginal Delivery   20y.o. yo G1P0 at 462w2das admitted to the hospital 09/12/2021 for induction of labor.  Indication for induction: PROM.  Patient had an uncomplicated labor course as follows: Membrane Rupture Time/Date: 10:00 PM ,09/11/2021   Delivery Method:Vaginal, Vacuum (Extractor)  Episiotomy: None  Lacerations:  1st degree;Perineal  Details of delivery can be found in separate delivery note.  Patient had a routine postpartum course. BP's stable, will get 2nd dose of lasix 2049maily today, so will rx x3 more doses. Reviewed pre-e s/s, reasons to seek care. Keep bp f/u as scheduled. [place] in [employer] [place], so no contraception needed right now. Patient is discharged home 09/15/21.  Newborn Data: Birth date:09/13/2021  Birth time:8:27 AM  Gender:Female  Living status:Living  Apgars:8 ,9  Weight:3255 g   Magnesium Sulfate received: No BMZ received: No Rhophylac:N/A MMR:N/A T-DaP:Given prenatally Flu: No Transfusion:No  Physical exam  Vitals:   09/13/21 2132 09/14/21 0134 09/14/21 0533 09/14/21 1314  BP: 127/75 122/68 110/65 112/68  Pulse: 91 77 78 80  Resp: 15 16 18 19   Temp: 98 F (36.7 C) 97.7 F (36.5 C) 97.9 F (36.6 C) 98 F (36.7  C)  TempSrc: Oral Oral Oral Oral  SpO2: 99% 100% 100%   Weight:      Height:       General: alert, cooperative, and no distress Lochia: appropriate Uterine Fundus: firm Incision: N/A DVT Evaluation: No evidence of DVT seen on physical exam. Negative Homan's sign. No cords or calf tenderness. No significant calf/ankle edema. Labs: Lab Results  Component Value Date   WBC 10.5 09/12/2021   HGB 12.0 09/12/2021   HCT 36.9 09/12/2021   MCV 93.4 09/12/2021   PLT 168 09/12/2021   CMP Latest Ref Rng & Units 09/12/2021  Glucose 70 - 99 mg/dL 90  BUN 6 - 20 mg/dL 11  Creatinine 0.44 - 1.00 mg/dL 0.70  Sodium 135 - 145 mmol/L 136  Potassium 3.5 - 5.1 mmol/L 4.2  Chloride 98 - 111 mmol/L 107  CO2 22 - 32 mmol/L 20(L)  Calcium 8.9 - 10.3 mg/dL 9.8  Total Protein 6.5 - 8.1 g/dL 6.5  Total Bilirubin 0.3 - 1.2 mg/dL 0.3  Alkaline Phos 38 - 126 U/L 132(H)  AST 15 - 41 U/L 20  ALT 0 - 44 U/L 19   Edinburgh Score: No flowsheet data found.   After visit meds:  Allergies as of 09/15/2021   No Known Allergies      Medication List     STOP taking these medications    docusate sodium 100 MG capsule Commonly known as: COLACE  TAKE these medications    furosemide 20 MG tablet Commonly known as: LASIX Take 1 tablet (20 mg total) by mouth daily. X 3 days   ibuprofen 600 MG tablet Commonly known as: ADVIL Take 1 tablet (600 mg total) by mouth every 6 (six) hours as needed for moderate pain, mild pain or cramping.   multivitamin-prenatal 27-0.8 MG Tabs tablet Take 1 tablet by mouth daily at 12 noon.         Discharge home in stable condition Infant Feeding: Bottle Infant Disposition:home with mother Discharge instruction: per After Visit Summary and Postpartum booklet. Activity: Advance as tolerated. Pelvic rest for 6 weeks.  Diet: routine diet Future Appointments: Future Appointments  Date Time Provider [place]  09/20/2021  2:00 PM [facility]  NURSE [facility]  10/13/2021  2:15 PM Starr Lake, CNM [facility]   Follow up Visit:  [place] [facility] Follow up.   Specialty: Obstetrics and Gynecology Why: 12/19 for bp check as scheduled, then 1/11 as scheduled for postpartum visit Contact information: [place] [id number] [phone]                 Please schedule this patient for a Virtual postpartum visit in 4 weeks with the following provider: Any provider. Additional Postpartum F/U:BP check 1 week  Low risk pregnancy complicated by: HTN Delivery mode:  Vaginal, Vacuum (Extractor)  Anticipated Birth Control:  POPs   09/15/2021 Roma Schanz, CNM

## 2021-09-13 NOTE — Progress Notes (Signed)
Patient now complete and +1 per recent check by RN. Feeling constant pressure. Okay to start pushing. Will reassess in 30 min.   Evalina Field, MD

## 2021-09-13 NOTE — Anesthesia Postprocedure Evaluation (Signed)
Anesthesia Post Note  Patient: April Ramos  Procedure(s) Performed: AN AD HOC LABOR EPIDURAL     Patient location during evaluation: Mother Baby Anesthesia Type: Epidural Level of consciousness: awake and alert Pain management: pain level controlled Vital Signs Assessment: post-procedure vital signs reviewed and stable Respiratory status: spontaneous breathing, nonlabored ventilation and respiratory function stable Cardiovascular status: stable Postop Assessment: no headache and epidural receding Anesthetic complications: no   No notable events documented.  Last Vitals:  Vitals:   09/13/21 1101 09/13/21 1215  BP: 138/76 129/76  Pulse: 77 86  Resp: 17 18  Temp: 37.4 C 36.9 C  SpO2:      Last Pain:  Vitals:   09/13/21 1215  TempSrc: Oral  PainSc:    Pain Goal:                   April Ramos

## 2021-09-14 ENCOUNTER — Encounter: Payer: Self-pay | Admitting: *Deleted

## 2021-09-14 ENCOUNTER — Other Ambulatory Visit: Payer: BC Managed Care – PPO

## 2021-09-14 ENCOUNTER — Encounter: Payer: BC Managed Care – PPO | Admitting: Student

## 2021-09-14 NOTE — Social Work (Signed)
CSW received consult for hx of marijuana use.  Referral was screened out due to the following:  ~MOB had no documented substance use after initial prenatal visit/+UPT. ~MOB had no positive drug screens after initial prenatal visit/+UPT.  Please consult CSW if current concerns arise or by MOB's request.  Manfred Arch, LCSWA Clinical Social Work [employer] [facility] [employer] [phone]

## 2021-09-14 NOTE — Progress Notes (Signed)
Post Partum Day 1 Subjective: no complaints, up ad lib, voiding and tolerating PO, small lochia, plans to breastfeed, oral progesterone-only contraceptive  Objective: Blood pressure 110/65, pulse 78, temperature 97.9 F (36.6 C), temperature source Oral, resp. rate 18, height 5\' 1"  (1.549 m), weight 78.2 kg, last menstrual period 11/05/2020, SpO2 100 %, unknown if currently breastfeeding.  Physical Exam:  General: alert, cooperative and no distress Lochia:normal flow Chest: CTAB Heart: RRR no m/r/g Abdomen: +BS, soft, nontender,  Uterine Fundus: firm DVT Evaluation: No evidence of DVT seen on physical exam. Extremities: trace edema  Recent Labs    09/12/21 0151  HGB 12.0  HCT 36.9    Assessment/Plan: GHTN, VSS, on lasix Plan for DC tomorrow    LOS: 2 days   14/11/22 09/14/2021, 7:38 AM

## 2021-09-15 ENCOUNTER — Other Ambulatory Visit (HOSPITAL_COMMUNITY): Payer: Self-pay

## 2021-09-15 MED ORDER — IBUPROFEN 600 MG PO TABS
600.0000 mg | ORAL_TABLET | Freq: Four times a day (QID) | ORAL | 0 refills | Status: AC | PRN
  Filled 2021-09-15: qty 30, 8d supply, fill #0

## 2021-09-15 MED ORDER — FUROSEMIDE 20 MG PO TABS
20.0000 mg | ORAL_TABLET | Freq: Every day | ORAL | 0 refills | Status: AC
  Filled 2021-09-15: qty 3, 3d supply, fill #0

## 2021-09-15 NOTE — Discharge Instructions (Signed)
[facility] signs of pre-ecl[facility]: Severe headache that does not go away with Tylenol Visual changes- seeing spots, double, blurred vision Pain under your right breast or upper abdomen that does not go away with Tums or heartburn medicine Nausea and/or vomiting Severe swelling in your hands, feet, and face      

## 2021-09-18 ENCOUNTER — Inpatient Hospital Stay (HOSPITAL_COMMUNITY): Admission: AD | Admit: 2021-09-18 | Payer: BC Managed Care – PPO | Source: Home / Self Care | Admitting: Family Medicine

## 2021-09-18 ENCOUNTER — Inpatient Hospital Stay (HOSPITAL_COMMUNITY): Payer: BC Managed Care – PPO

## 2021-09-20 ENCOUNTER — Ambulatory Visit: Payer: BC Managed Care – PPO

## 2021-09-20 ENCOUNTER — Encounter: Payer: Self-pay | Admitting: *Deleted

## 2021-09-22 ENCOUNTER — Telehealth (INDEPENDENT_AMBULATORY_CARE_PROVIDER_SITE_OTHER): Payer: Medicaid Other

## 2021-09-22 DIAGNOSIS — Z0130 Encounter for examination of blood pressure without abnormal findings: Secondary | ICD-10-CM

## 2021-09-22 NOTE — Progress Notes (Signed)
Called pt, call cannot be completed as dialed. Sent MyChart message to request patient check her BP at home and send via MyChart or reschedule her appt.

## 2021-09-28 ENCOUNTER — Telehealth (HOSPITAL_COMMUNITY): Payer: Self-pay | Admitting: *Deleted

## 2021-09-28 NOTE — Telephone Encounter (Signed)
Attempted hospital discharge follow-up call. No voicemail available. Deforest Hoyles, RN, 09/28/21, [phone]

## 2021-09-29 ENCOUNTER — Encounter: Payer: Self-pay | Admitting: Student

## 2021-09-30 ENCOUNTER — Other Ambulatory Visit: Payer: Self-pay

## 2021-09-30 ENCOUNTER — Ambulatory Visit (INDEPENDENT_AMBULATORY_CARE_PROVIDER_SITE_OTHER): Payer: BC Managed Care – PPO

## 2021-09-30 VITALS — BP 117/85 | HR 86 | Ht 61.0 in | Wt 151.0 lb

## 2021-09-30 DIAGNOSIS — R8290 Unspecified abnormal findings in urine: Secondary | ICD-10-CM

## 2021-09-30 LAB — POCT URINALYSIS DIP (DEVICE)
Bilirubin Urine: NEGATIVE
Glucose, UA: NEGATIVE mg/dL
Ketones, ur: NEGATIVE mg/dL
Nitrite: NEGATIVE
Protein, ur: NEGATIVE mg/dL
Specific Gravity, Urine: 1.02 (ref 1.005–1.030)
Urobilinogen, UA: 0.2 mg/dL (ref 0.0–1.0)
pH: 7 (ref 5.0–8.0)

## 2021-09-30 MED ORDER — PHENAZOPYRIDINE HCL 200 MG PO TABS
200.0000 mg | ORAL_TABLET | Freq: Three times a day (TID) | ORAL | 0 refills | Status: AC | PRN
Start: 2021-09-30 — End: ?

## 2021-09-30 MED ORDER — NITROFURANTOIN MONOHYD MACRO 100 MG PO CAPS
100.0000 mg | ORAL_CAPSULE | Freq: Two times a day (BID) | ORAL | 0 refills | Status: AC
Start: 2021-09-30 — End: ?

## 2021-09-30 NOTE — Progress Notes (Signed)
Pt here today for questionable UTI.  Pt reports that she is having pressure at the end of urine stream.  Pt states that she does some burning with urination as well.  UA results- large blood and large leukocytes.  Pt reports that is currently still having vaginal bleeding after delivery.  Pt denies breastfeeding.  Pt prescribed Macrobid 100 mg po bid and Pyridium 200 mg po tid per standing protocol.  Pt advised that that her urine will be sent for urine culture and if we need to treat differently we will call her.  Pt verbalized understanding.   Leonette Nutting  09/30/21

## 2021-10-01 NOTE — Addendum Note (Signed)
Addended by: Faythe Casa on: 10/01/2021 11:58 AM   Modules accepted: Orders

## 2021-10-05 LAB — URINE CULTURE

## 2021-10-13 ENCOUNTER — Telehealth (INDEPENDENT_AMBULATORY_CARE_PROVIDER_SITE_OTHER): Payer: BC Managed Care – PPO | Admitting: Student

## 2021-10-13 DIAGNOSIS — Z3480 Encounter for supervision of other normal pregnancy, unspecified trimester: Secondary | ICD-10-CM

## 2021-10-13 MED ORDER — NORETHIN ACE-ETH ESTRAD-FE 1-20 MG-MCG PO TABS: 1.0000 | ORAL_TABLET | Freq: Every day | ORAL | 11 refills | Status: AC

## 2021-10-13 NOTE — Progress Notes (Signed)
Provider location: [facility] for [facility] [employer] [facility]   Patient location: Home  I connected with Ree Shay on 10/13/21 at  2:15 PM EST by Mychart Video Encounter and verified that I am speaking with the correct person using two identifiers.       I discussed the limitations, risks, security and privacy concerns of performing an evaluation and management service virtually and the availability of in person appointments. I also discussed with the patient that there may be a patient responsible charge related to this service. The patient expressed understanding and agreed to proceed.  Post Partum Visit Note Subjective:   April Ramos is a 21 y.o. G44P1001 female who presents for a postpartum visit. She is 4 weeks postpartum following a vacuum-assisted vaginal delivery.  I have fully reviewed the prenatal and intrapartum course. The delivery was at 40.2 gestational weeks.  Anesthesia: epidural. Postpartum course has been uneventful. Baby is doing well. Baby is feeding by both breast and bottle - Similac Advance. Bleeding staining only. Bowel function is normal. Bladder function is normal. Patient is not sexually active. Contraception method is none. Postpartum depression screening: negative. Patient was discharged from hospital with [facility] and follow up blood pressure was normal 3 weeks PP. She is still spotting and has not had her period back again.   The pregnancy intention screening data noted above was reviewed. Potential methods of contraception were discussed. The patient elected to proceed with No data recorded.    The following portions of the patient's history were reviewed and updated as appropriate: allergies, current medications, past family history, past medical history, past social history, past surgical history, and problem list.  Review of Systems Pertinent items are noted in HPI.  Objective:  There were no vitals taken for this visit.    General:  Alert,  oriented and cooperative. Patient is in no acute distress.  Respiratory: Normal respiratory effort, no problems with respiration noted  Mental Status: Normal mood and affect. Normal behavior. Normal judgment and thought content.  Rest of physical exam deferred due to type of encounter   Assessment:    Healthy postpartum exam.  Plan:  Essential components of care per ACOG recommendations:  1.  Mood and well being: Patient with negative depression screening today. Reviewed local resources for support.  - Patient does not use tobacco.  - hx of drug use? No   If yes, discussed support systems  2. Infant care and feeding:  -Patient currently breastmilk feeding? No If breastmilk feeding discussed return to work and pumping. If needed, patient was provided letter for work to allow for every 2-3 hr pumping breaks, and to be granted a private location to express breastmilk and refrigerated area to store breastmilk. Reviewed importance of draining breast regularly to support lactation. -Social determinants of health (SDOH) reviewed in EPIC. No concerns  3. Sexuality, contraception and birth spacing - Patient does not want a pregnancy in the next year.  Desired family size is 2-3 children. She is not sexually active as her husband is working overseas in [place].   - Reviewed forms of contraception in tiered fashion. Patient desired oral contraceptives (estrogen/progesterone) today.  She denies history of high blood pressure (other than briefly IP), blood clots, stroke or migraine with aura in herself or close relative.  - Discussed birth spacing of 18 months   4. Sleep and fatigue -Encouraged family/partner/community support of 4 hrs of uninterrupted sleep to help with mood and fatigue  5. Physical Recovery  -  Discussed patients delivery and complications. She feels good about her delivery.  - Patient had a 1st degree laceration, perineal healing reviewed. Patient expressed understanding - Patient  has urinary incontinence? No  - Patient is safe to resume physical and sexual activity  6.  Health Maintenance - Last pap smear done 04/2021 and was normal with negative HPV. 7. No Chronic Disease - PCP follow up  I provided minutes of face-to-face time during this encounter.    No follow-ups on file.  No future appointments.   Marylene Land, CNM [facility] for [employer]

## 2022-01-30 IMAGING — US US OB COMP LESS 14 WK
1 series · 15 of 28 positions shown · non-contrast
Comparison: None.

CLINICAL DATA: Vaginal bleeding with positive pregnancy test.

EXAM:
OBSTETRIC <14 WK US AND TRANSVAGINAL OB US
TECHNIQUE: Both transabdominal and transvaginal ultrasound examinations were
performed for complete evaluation of the gestation as well as the
maternal uterus, adnexal regions, and pelvic cul-de-sac.
Transvaginal technique was performed to assess early pregnancy.

[Series 1: us ob comp less 14 wk · 36 acquisitions, 15 frames shown]
[im 1/36]
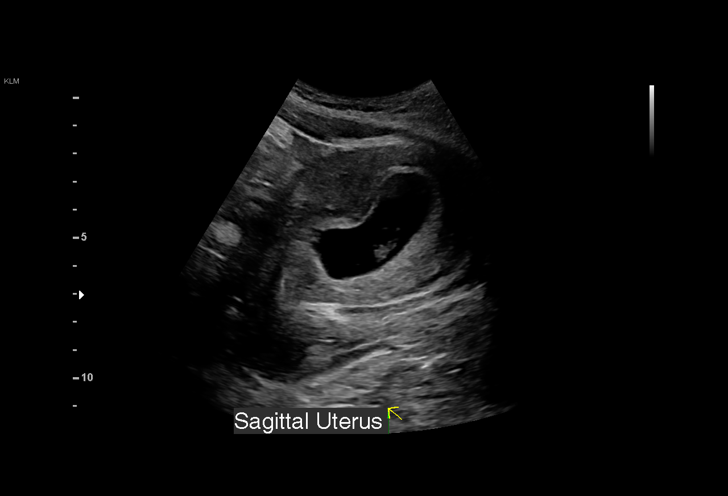
[im 3/36]
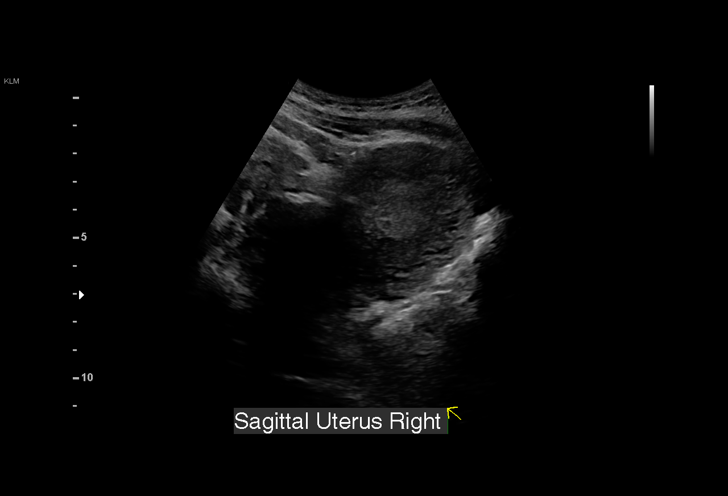
[im 6/36]
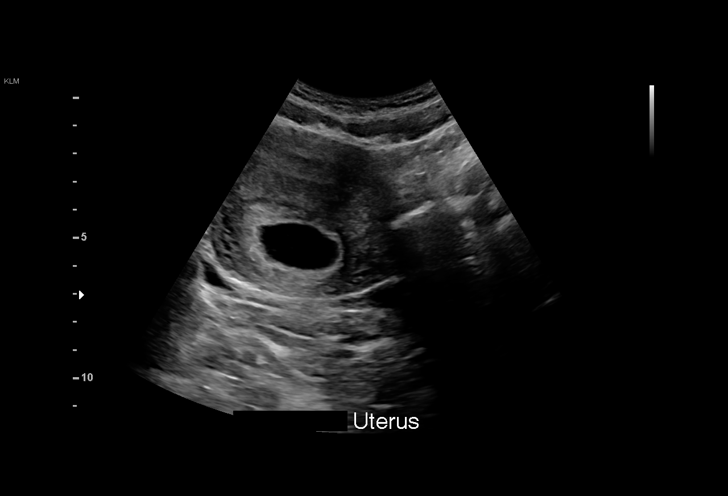
[im 8/36]
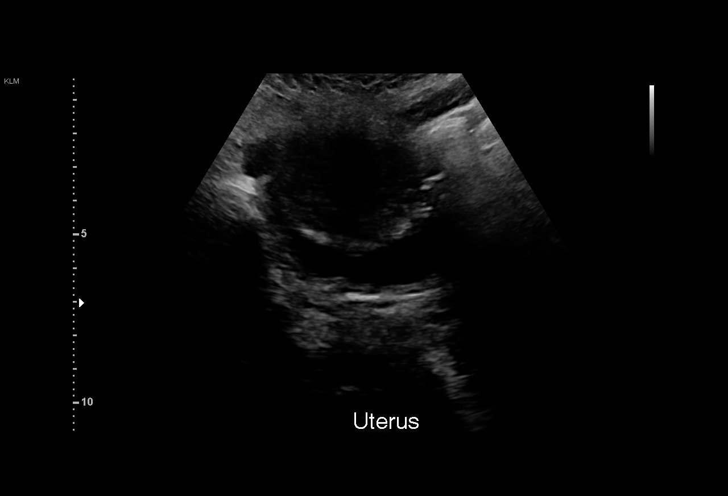
[im 11/36]
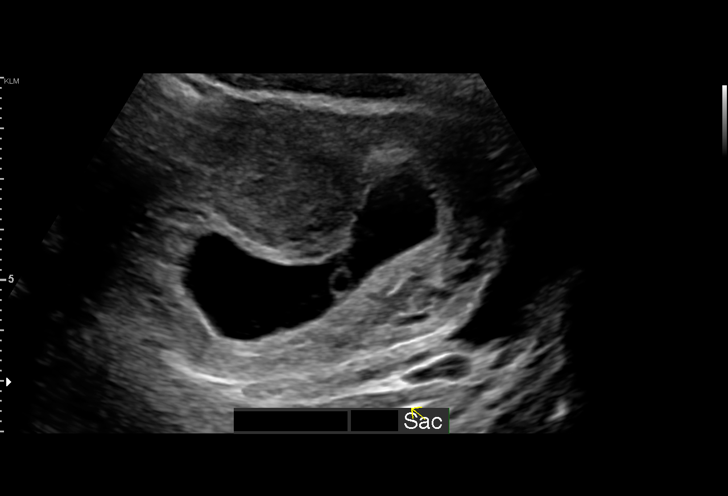
[im 13/36]
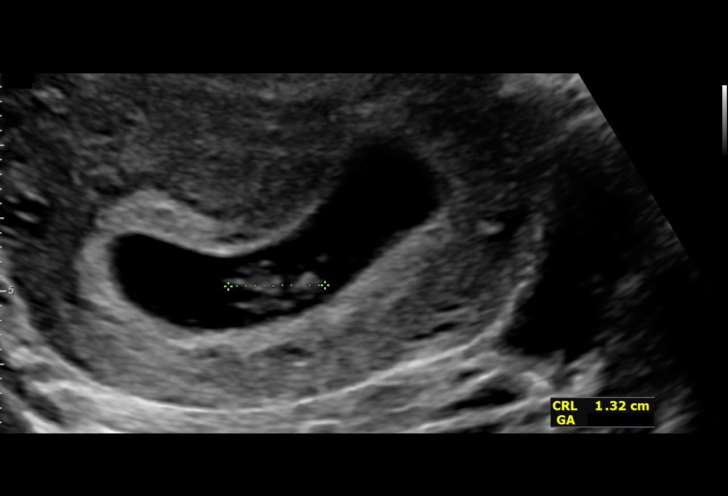
[im 16/36]
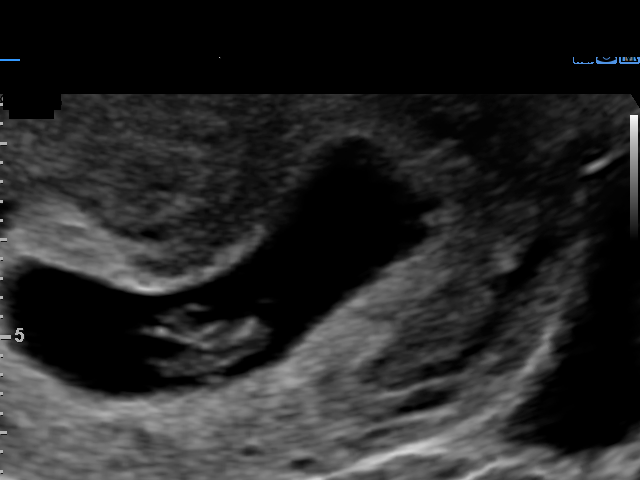
[im 19/36]
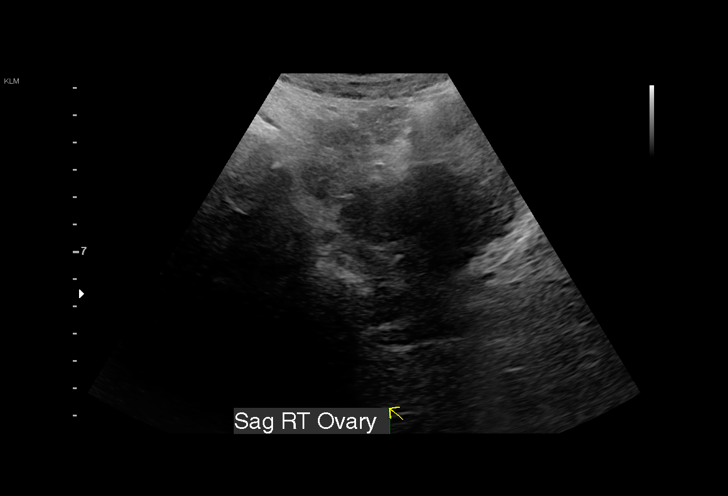
[im 20/36]
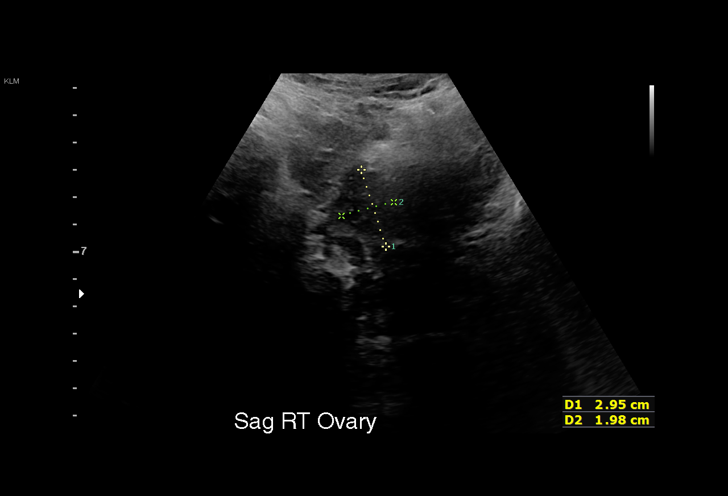
[im 23/36]
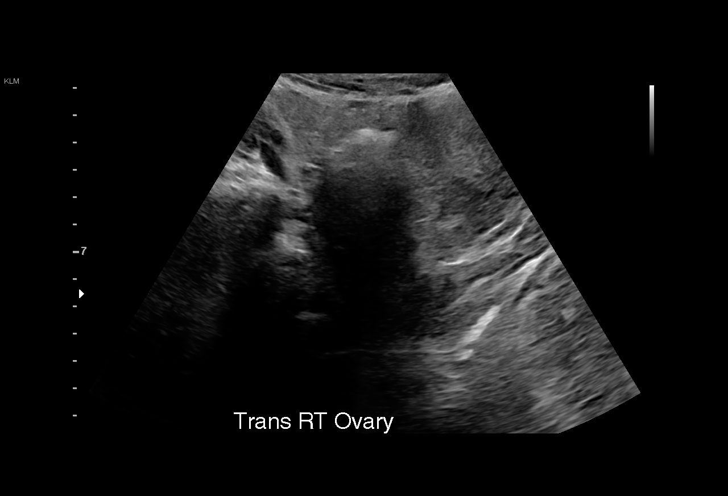
[im 25/36]
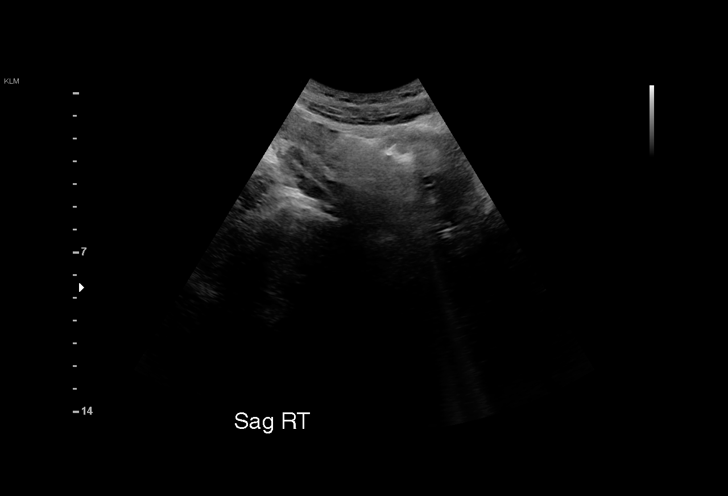
[im 28/36]
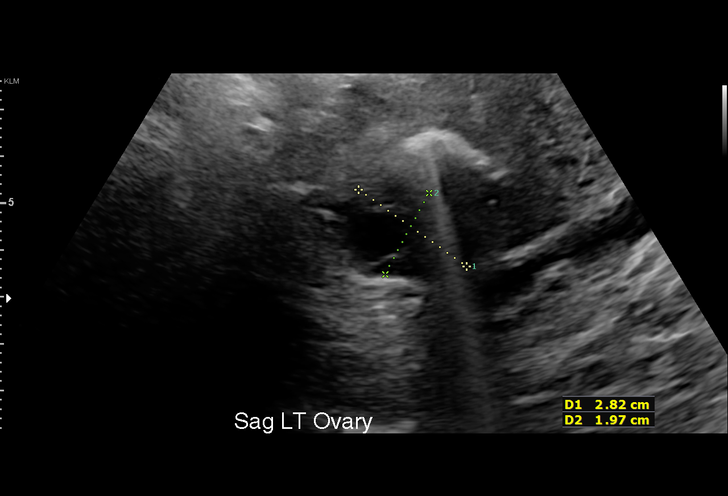
[im 30/36]
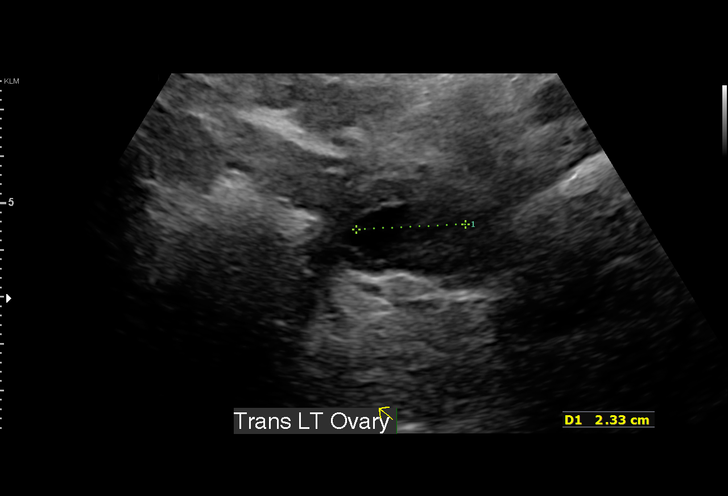
[im 33/36]
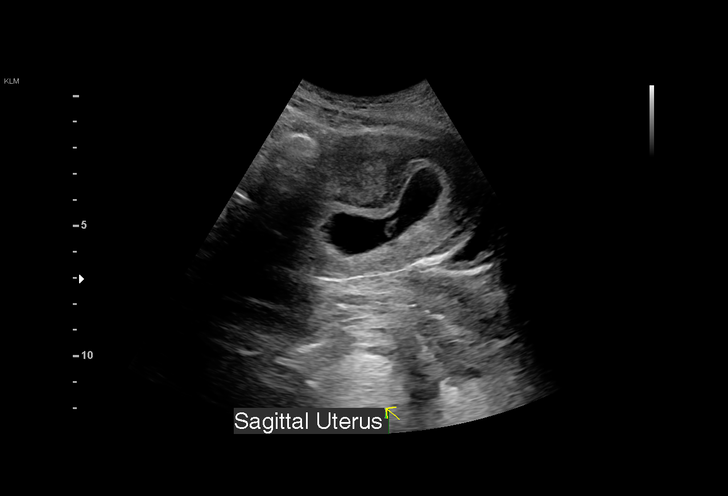
[im 36/36]
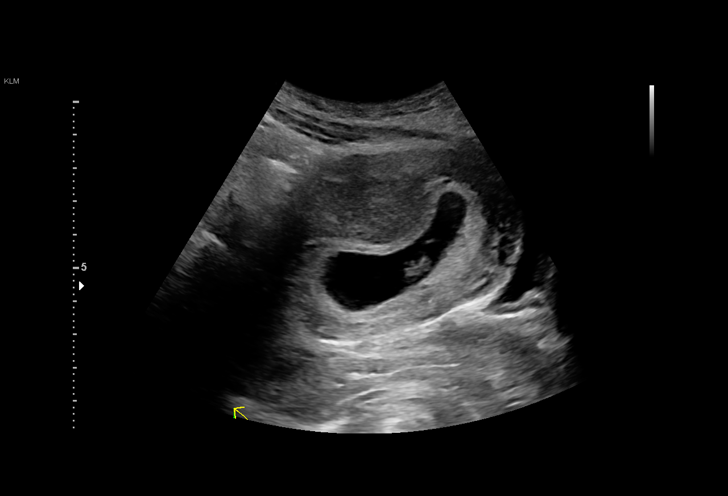

[15 of 28 positions shown; findings below may reference images not displayed]

FINDINGS: Intrauterine gestational sac: Single.

Yolk sac:  Visualized.

Embryo:  Visualized.

Cardiac Activity: Visualized.

Heart Rate: 157 bpm

CRL: 12.7 mm   7 w   3 d                  US EDC: 09/11/2021

Subchorionic hemorrhage:  None visualized.

Maternal uterus/adnexae: Unremarkable.
IMPRESSION: Single living intrauterine gestation at estimated 7 week 3 day
gestational age by crown-rump length.

## 2022-04-27 IMAGING — US US MFM OB COMP +14 WKS
1 series · 13 of 28 positions shown · non-contrast
Comparison: none

[Series 1: us mfm ob comp +14 wks · 13 of 123 slices shown]
[im 5/123]
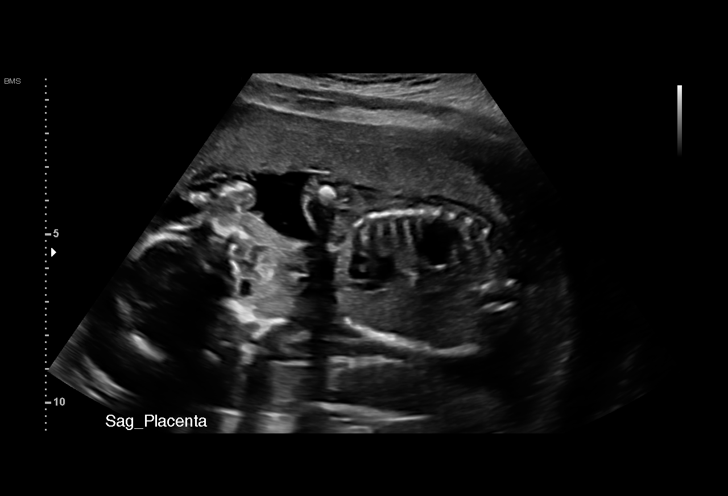
[im 14/123]
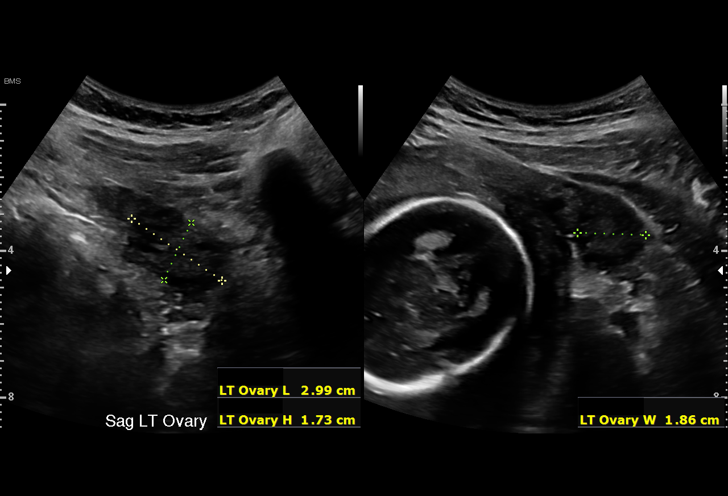
[im 23/123]
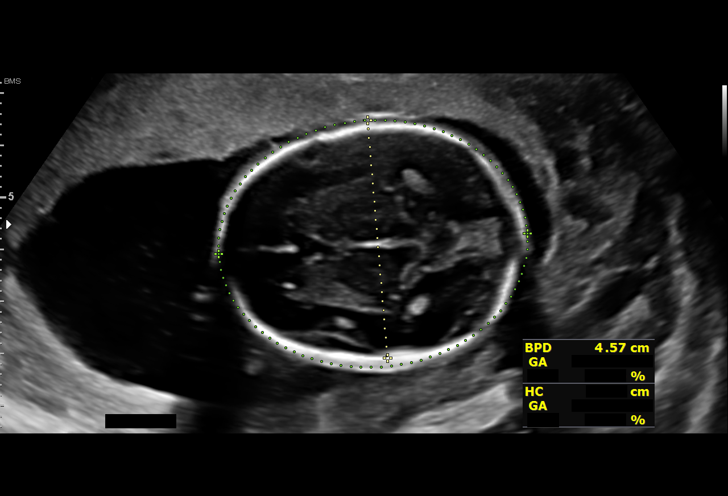
[im 32/123]
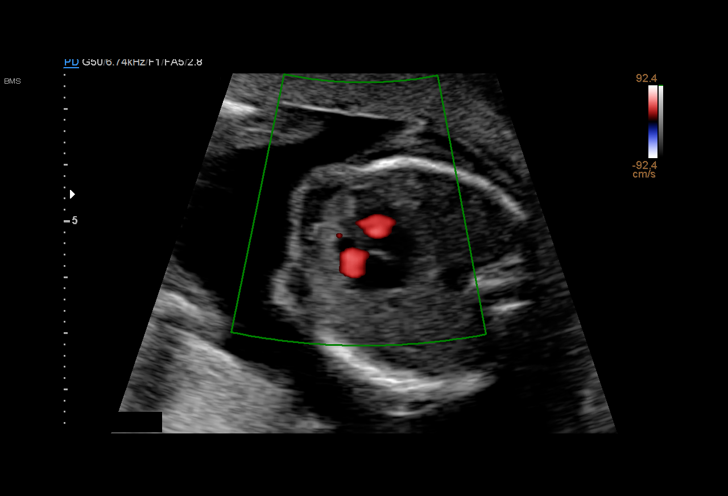
[im 41/123]
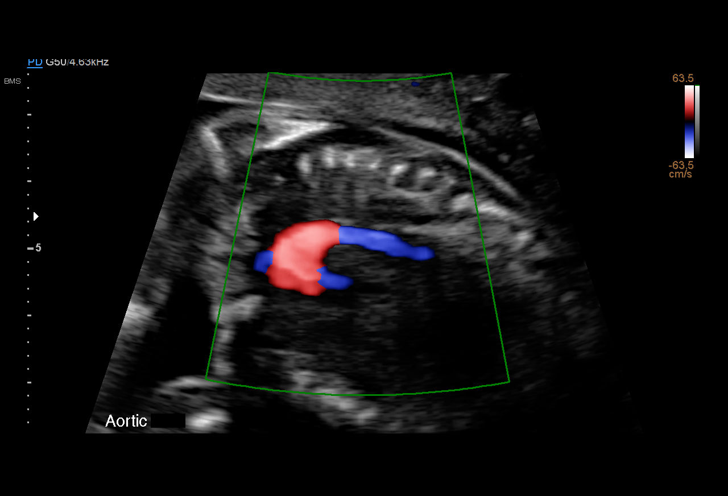
[im 50/123]
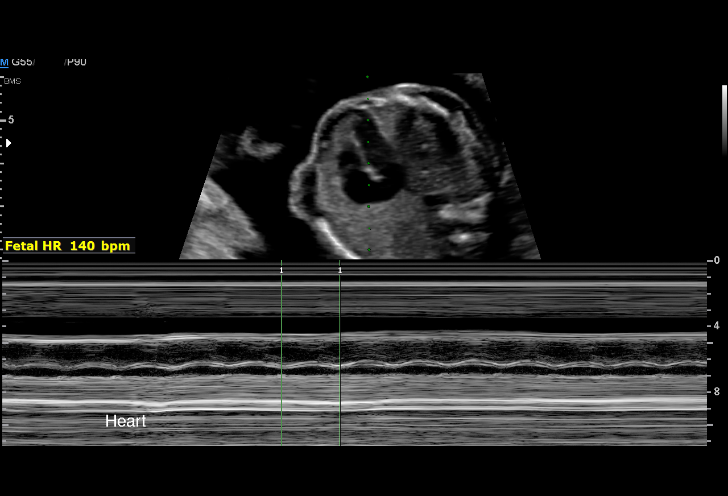
[im 64/123]
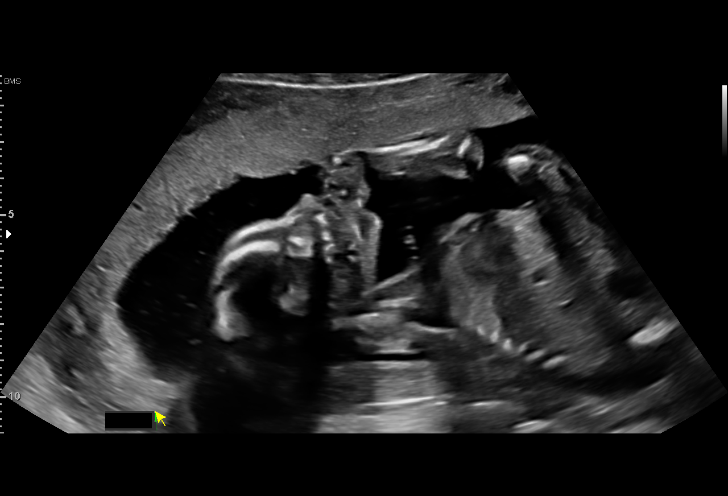
[im 73/123]
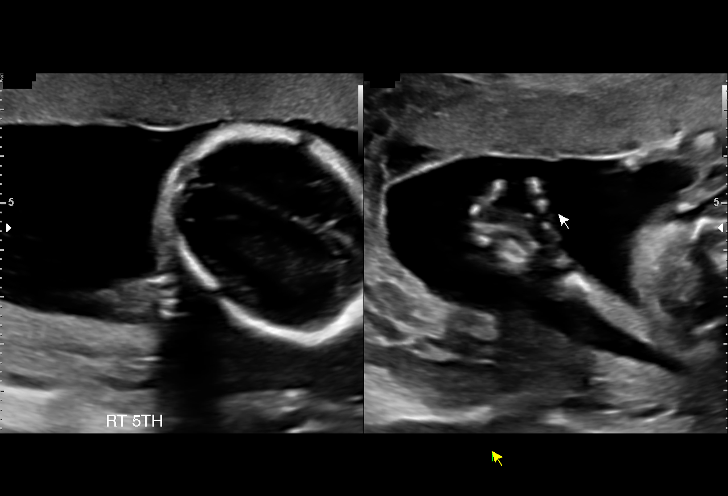
[im 82/123]
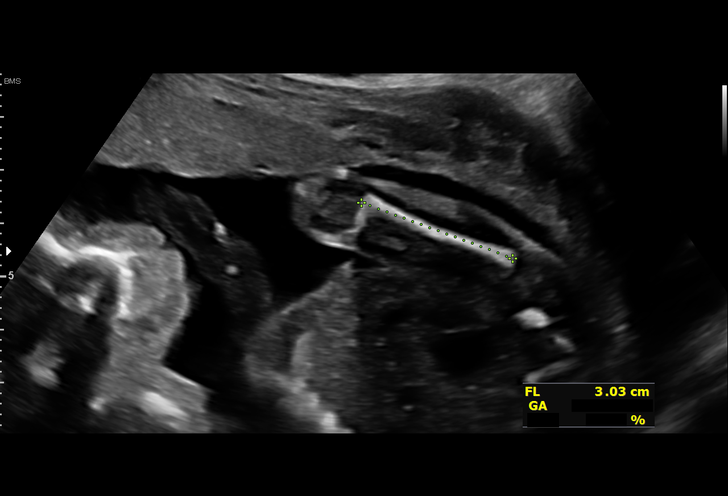
[im 91/123]
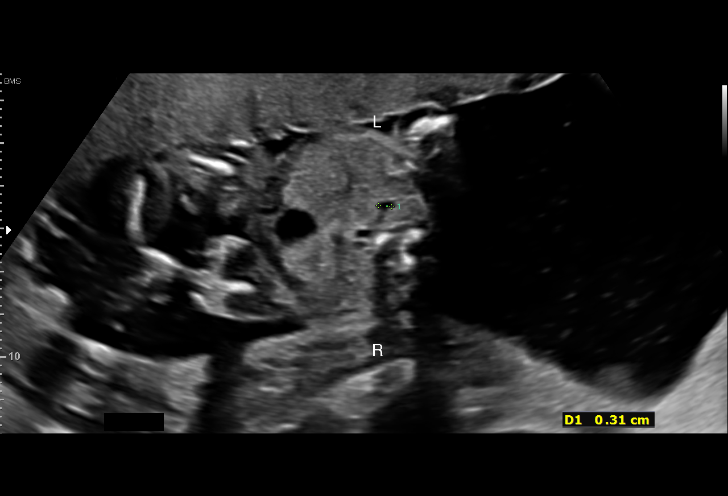
[im 100/123]
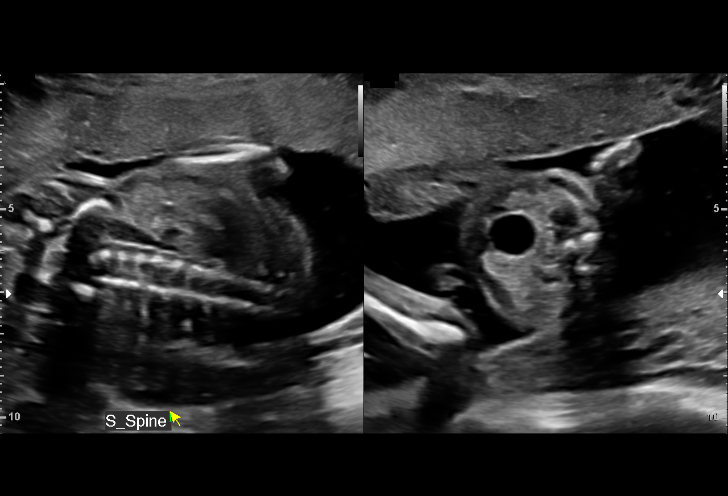
[im 109/123]
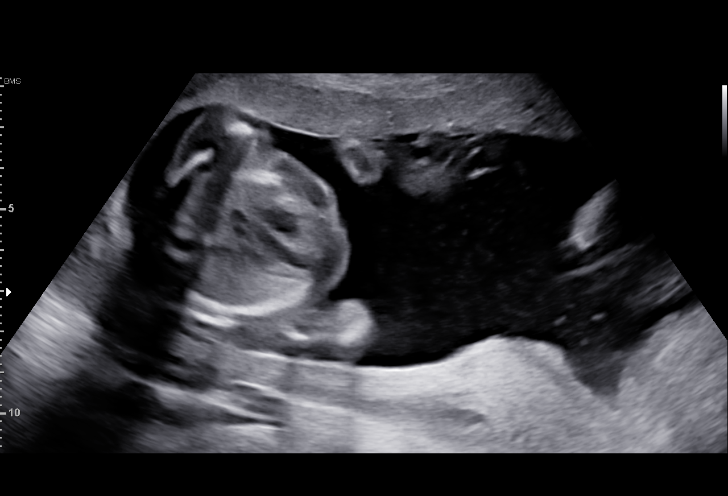
[im 118/123]
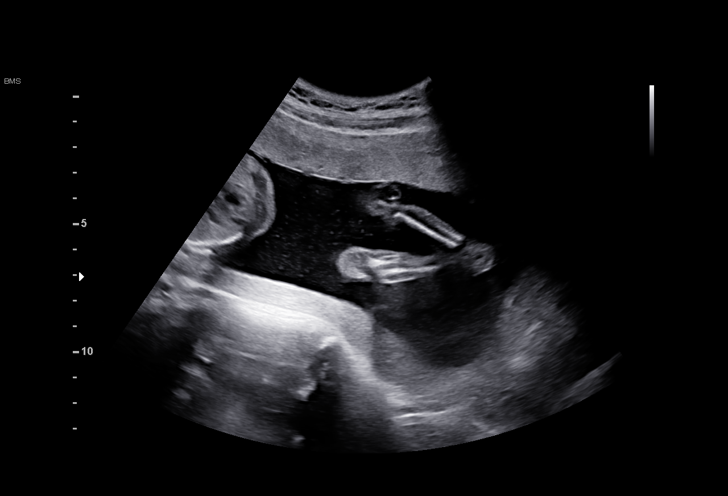

[13 of 28 positions shown; findings below may reference images not displayed]

1  US MFM OB COMP + 14 WK                76805.01    ZAIRE TAMEZ

Indications

 Encounter for antenatal screening for
 malformations
 19 weeks gestation of pregnancy
Fetal Evaluation

 Num Of Fetuses:         1
 Fetal Heart Rate(bpm):  140
 Cardiac Activity:       Observed
 Presentation:           Breech
 Placenta:               Anterior
 P. Cord Insertion:      Visualized, central

 Amniotic Fluid
 AFI FV:      Within normal limits

                             Largest Pocket(cm)

Biometry

 BPD:      45.3  mm     G. Age:  19w 5d         43  %    CI:        72.96   %    70 - 86
                                                         FL/HC:      17.9   %    16.8 -
 HC:      168.6  mm     G. Age:  19w 4d         26  %    HC/AC:      1.09        1.09 -
 AC:      154.9  mm     G. Age:  20w 5d         71  %    FL/BPD:     66.4   %
 FL:       30.1  mm     G. Age:  19w 2d         23  %    FL/AC:      19.4   %    20 - 24
 HUM:      29.4  mm     G. Age:  19w 4d         46  %
 CER:      20.6  mm     G. Age:  19w 5d         64  %
 NFT:       3.7  mm

 LV:        7.4  mm
 CM:        4.9  mm
 Est. FW:     324  gm    0 lb 11 oz      52  %
OB History

 Gravidity:    1
 Living:       0
Gestational Age

 LMP:           24w 1d        Date:  11/05/20                 EDD:   08/12/21
 U/S Today:     19w 6d                                        EDD:   09/11/21
 Best:          19w 6d     Det. By:  Early Ultrasound         EDD:   09/11/21
                                     (01/26/21)
Anatomy

 Cranium:               Appears normal         Aortic Arch:            Appears normal
 Cavum:                 Appears normal         Ductal Arch:            Not well visualized
 Ventricles:            Appears normal         Diaphragm:              Appears normal
 Choroid Plexus:        Appears normal         Stomach:                Appears normal, left
                                                                       sided
 Cerebellum:            Appears normal         Abdomen:                Appears normal
 Posterior Fossa:       Appears normal         Abdominal Wall:         Appears nml (cord
                                                                       insert, abd wall)
 Nuchal Fold:           Appears normal         Cord Vessels:           Appears normal (3
                                                                       vessel cord)
 Face:                  Orbits nl; profile     Kidneys:                Appear normal
                        limited
 Lips:                  Appears normal         Bladder:                Appears normal
 Thoracic:              Appears normal         Spine:                  Ltd views no
                                                                       intracranial signs of
                                                                       NTD
 Heart:                 Appears normal         Upper Extremities:      Appears normal
                        (4CH, axis, and
                        situs)
 RVOT:                  Appears normal         Lower Extremities:      Appears normal
 LVOT:                  Appears normal

 Other:  Heels/feet and open hands/5th digits visualized. Fetus appears to be
         female. Technically difficult due to fetal position.
Cervix Uterus Adnexa

 Cervix
 Length:           3.91  cm.
 Normal appearance by transabdominal scan.

 Uterus
 No abnormality visualized.

 Right Ovary
 Within normal limits.

 Left Ovary
 Within normal limits.

 Cul De Sac
 No free fluid seen.
 Adnexa
 No adnexal mass visualized.
Comments

 This patient was seen for a detailed fetal anatomy scan.
 She denies any significant past medical history and denies
 any problems in her current pregnancy.
 The results of her screening test for fetal aneuploidy were not
 available today.
 She was informed that the fetal growth and amniotic fluid
 level were appropriate for her gestational age.
 There were no obvious fetal anomalies noted on today's
 ultrasound exam.  However, the views of the fetal anatomy
 were limited today due to the fetal position.
 The patient was informed that anomalies may be missed due
 to technical limitations. If the fetus is in a suboptimal position
 or maternal habitus is increased, visualization of the fetus in
 the maternal uterus may be impaired.
 A follow-up exam was scheduled in 4 weeks to complete the
 views of the fetal anatomy.

## 2022-05-12 ENCOUNTER — Telehealth: Payer: Self-pay | Admitting: Family Medicine

## 2022-05-12 NOTE — Telephone Encounter (Signed)
Patient called in wanting to know when her next annual was due, I told her I would check with a nurse and give her a call back once I knew.

## 2022-05-16 NOTE — Telephone Encounter (Signed)
Next annual visit due January 2024.

## 2022-10-04 ENCOUNTER — Ambulatory Visit: Payer: BC Managed Care – PPO

## 2023-01-09 ENCOUNTER — Ambulatory Visit: Payer: Medicaid Other

## 2023-06-27 ENCOUNTER — Ambulatory Visit: Payer: Self-pay | Admitting: Obstetrics and Gynecology

## 2023-08-17 ENCOUNTER — Ambulatory Visit (INDEPENDENT_AMBULATORY_CARE_PROVIDER_SITE_OTHER): Payer: BC Managed Care – PPO | Admitting: Obstetrics and Gynecology

## 2023-08-17 ENCOUNTER — Other Ambulatory Visit: Payer: Self-pay

## 2023-08-17 ENCOUNTER — Other Ambulatory Visit (HOSPITAL_COMMUNITY)
Admission: RE | Admit: 2023-08-17 | Discharge: 2023-08-17 | Disposition: A | Discharge status: Home / Self Care | Payer: BC Managed Care – PPO | Source: Ambulatory Visit | Attending: Obstetrics and Gynecology | Admitting: Obstetrics and Gynecology

## 2023-08-17 ENCOUNTER — Encounter: Payer: Self-pay | Admitting: Obstetrics and Gynecology

## 2023-08-17 VITALS — BP 109/73 | HR 87 | Wt 119.6 lb

## 2023-08-17 DIAGNOSIS — B9689 Other specified bacterial agents as the cause of diseases classified elsewhere: Secondary | ICD-10-CM | POA: Insufficient documentation

## 2023-08-17 DIAGNOSIS — N926 Irregular menstruation, unspecified: Secondary | ICD-10-CM | POA: Diagnosis not present

## 2023-08-17 DIAGNOSIS — N760 Acute vaginitis: Secondary | ICD-10-CM | POA: Diagnosis not present

## 2023-08-17 DIAGNOSIS — Z113 Encounter for screening for infections with a predominantly sexual mode of transmission: Secondary | ICD-10-CM | POA: Insufficient documentation

## 2023-08-17 DIAGNOSIS — Z01419 Encounter for gynecological examination (general) (routine) without abnormal findings: Secondary | ICD-10-CM | POA: Diagnosis not present

## 2023-08-17 LAB — POCT PREGNANCY, URINE: Preg Test, Ur: NEGATIVE

## 2023-08-17 NOTE — Progress Notes (Signed)
ANNUAL EXAM Patient name: April Ramos MRN [id number]  Date of birth: 2001/08/21 Chief Complaint:   Gynecologic Exam  History of Present Illness:   Sheli Ramos is a 22 y.o. G1P1001 being seen today for a routine annual exam.  Current complaints: annual   Menstrual concerns? Yes  a little more regular after menses; average once a month but not entirely sure when it's going to happen, no molinimal symptoms typically. Previously could go a few months without a menses but hasn't had that happen more recently but that was prior to pregnancy Could go 2-3 months without a menses - previously on birth control which helped regulate it but would feel sick while on it  Breast or nipple changes? No  Contraception use? Yes condoms, inconsistent use Sexually active? Yes female   Having vaginal discharge, no itching - self swab completed  HPV vaccine not done  No FH hx of ovarian, colon, or breat cancer   No LMP recorded.   Upstream - 08/17/23 1606       Pregnancy Intention Screening   Does the patient want to become pregnant in the next year? No    Does the patient's partner want to become pregnant in the next year? No    Would the patient like to discuss contraceptive options today? No      Contraception Wrap Up   Current Method No Contraceptive Precautions    End Method No Contraception Precautions    Contraception Counseling Provided No            The pregnancy intention screening data noted above was reviewed. Potential methods of contraception were discussed. The patient elected to proceed with No Contraception Precautions.   Last pap     Component Value Date/Time   DIAGPAP  04/15/2021 1444    - Negative for intraepithelial lesion or malignancy (NILM)   HPVHIGH Negative 04/15/2021 1444   ADEQPAP Satisfactory for evaluation.  The absence of an 04/15/2021 1444   ADEQPAP  04/15/2021 1444    endocervical/transformation zone component is not uncommon in pregnant   ADEQPAP  patients. 04/15/2021 1444     H/O abnormal pap: no Last mammogram: n/a.  Last colonoscopy: n/a.      08/17/2023    4:03 PM 09/07/2021    5:08 PM 09/01/2021    3:31 PM 08/25/2021    2:21 PM 08/02/2021   11:16 AM  Depression screen PHQ 2/9  Decreased Interest 1 2 2 2 2   Down, Depressed, Hopeless 1 0 2 1 1   PHQ - 2 Score 2 2 4 3 3   Altered sleeping 0 2 2 3 2   Tired, decreased energy 0 2 3 3 2   Change in appetite 0 0 2 0 0  Feeling bad or failure about yourself  1 0 0 0 0  Trouble concentrating 0 0 0 0 0  Moving slowly or fidgety/restless 0 0 0 0 0  Suicidal thoughts 0 0 0 0 0  PHQ-9 Score 3 6 11 9 7         08/17/2023    4:04 PM 09/07/2021    5:08 PM 09/01/2021    3:31 PM 08/25/2021    2:21 PM  GAD 7 : Generalized Anxiety Score  Nervous, Anxious, on Edge 1 2 2 2   Control/stop worrying 1 0 1 2  Worry too much - different things 1 0 1 2  Trouble relaxing 0 3 3 1   Restless 0 0 0 0  Easily annoyed or irritable 1  2 1 2   Afraid - awful might happen 1 0 0 0  Total GAD 7 Score 5 7 8 9      Review of Systems:   Pertinent items are noted in HPI Denies any headaches, blurred vision, fatigue, shortness of breath, chest pain, abdominal pain, abnormal vaginal discharge/itching/odor/irritation, problems with periods, bowel movements, urination, or intercourse unless otherwise stated above. Pertinent History Reviewed:  Reviewed past medical,surgical, social and family history.  Reviewed problem list, medications and allergies. Physical Assessment:   Vitals:   08/17/23 1603  BP: 109/73  Pulse: 87  Weight: 119 lb 9.6 oz (54.3 kg)  Body mass index is 22.6 kg/m.        Physical Examination:   General appearance - well appearing, and in no distress  Mental status - alert, oriented to person, place, and time  Psych:  She has a normal mood and affect  Skin - warm and dry, normal color, no suspicious lesions noted  Chest - effort normal, all lung fields clear to auscultation  bilaterally  Heart - normal rate and regular rhythm  Abdomen - soft, nontender, nondistended, no masses or organomegaly  Pelvic - deferred  Extremities:  No swelling or varicosities noted  Chaperone present for exam  No results found for this or any previous visit (from the past 24 hour(s)).    Assessment & Plan:  1. Well woman exam with routine gynecological exam - Cervical cancer screening: Discussed screening Q3 years. Reviewed importance of annual exams and limits of pap smear. Pap with reflex HPV due 2025 - GC/CT: Discussed and recommended. Pt  accepts - Gardasil: has not yet had. Will provide information - Birth Control: Condoms - Breast Health: Encouraged self breast awareness/exams.  - Follow-up: 12 months and prn   2. Screening examination for STD (sexually transmitted disease) - RPR+HBsAg+HCVAb+... - Cervicovaginal ancillary only( [place])  3. Irregular menses Labs today to further evaluate though reassuring that menses are now more regular  - Testosterone,Free and Total - TSH Rfx on Abnormal to Free T4 - Pregnancy, urine POC  Orders Placed This Encounter  Procedures   RPR+HBsAg+HCVAb+...   Testosterone,Free and Total   TSH Rfx on Abnormal to Free T4    Meds: No orders of the defined types were placed in this encounter.   Follow-up: No follow-ups on file.  April Shire, MD 08/17/2023 4:22 PM

## 2023-08-18 LAB — CERVICOVAGINAL ANCILLARY ONLY
Bacterial Vaginitis (gardnerella): POSITIVE — AB
Chlamydia: NEGATIVE
Comment: NEGATIVE
Comment: NEGATIVE
Comment: NEGATIVE
Comment: NORMAL
Neisseria Gonorrhea: NEGATIVE
Trichomonas: NEGATIVE

## 2023-08-18 LAB — TSH RFX ON ABNORMAL TO FREE T4: TSH: 0.674 u[IU]/mL (ref 0.450–4.500)

## 2023-08-20 LAB — TESTOSTERONE,FREE AND TOTAL
Testosterone, Free: <0.2 pg/mL (ref 0.0–4.2)
Testosterone: <3 ng/dL — ABNORMAL LOW (ref 13–71)

## 2023-08-20 LAB — RPR+HBSAG+HCVAB+...
HIV Screen 4th Generation wRfx: NONREACTIVE
Hep C Virus Ab: NONREACTIVE
Hepatitis B Surface Ag: NEGATIVE
RPR Ser Ql: NONREACTIVE

## 2023-08-21 ENCOUNTER — Encounter: Payer: Self-pay | Admitting: Obstetrics and Gynecology

## 2023-08-22 ENCOUNTER — Other Ambulatory Visit: Payer: Self-pay | Admitting: Lactation Services

## 2023-08-22 MED ORDER — METRONIDAZOLE 500 MG PO TABS: 500.0000 mg | ORAL_TABLET | Freq: Two times a day (BID) | ORAL | 0 refills | Status: AC

## 2023-08-25 ENCOUNTER — Telehealth: Payer: Self-pay | Admitting: Lactation Services

## 2023-08-25 NOTE — Telephone Encounter (Signed)
-----   Message from Jerene Bears sent at 08/24/2023  7:06 AM EST ----- Jasmine December,  I think you already spoke to this pt when she sent a message or called about her BV so she has been treated with flagyl 500mg  bid x 7 days.  Please lethe rknow her testosterone level was low.  This is fine as Dr Briscoe Deutscher was checking for PCOS and there typically an elevated testosterone level with that diagnosis.  All of her other STI testing was negative.  She was having irregular cycles but these seem to have normalized.  If occurs again, please have er call the office for appointment.  Thank you.  Leda Quail, covering for Dr. Briscoe Deutscher

## 2023-08-25 NOTE — Telephone Encounter (Signed)
Called and relayed massage from Dr. Hyacinth Meeker. Patient reports no questions or concerns. She just completed her cycle. She is aware to call the office with any other questions or concerns.

## 2023-09-11 ENCOUNTER — Other Ambulatory Visit: Payer: Self-pay | Admitting: Lactation Services

## 2023-09-11 MED ORDER — METRONIDAZOLE 0.75 % VA GEL: 1.0000 | Freq: Every day | VAGINAL | 0 refills | Status: AC

## 2023-09-11 NOTE — Progress Notes (Signed)
Patient not tolerating oral Flagyl due to vomiting. Will send in Metrogel instead per Dr. Nobie Putnam. Patient notified via Mychart.

## 2024-07-18 DIAGNOSIS — L42 Pityriasis rosea: Secondary | ICD-10-CM | POA: Diagnosis not present

## 2024-12-02 ENCOUNTER — Ambulatory Visit: Admitting: Obstetrics and Gynecology
